# Patient Record
Sex: Female | Born: 1955
Health system: Southern US, Community
[De-identification: ages and names within clinical notes are randomized; demographics above are authoritative.]

## PROBLEM LIST (undated history)

## (undated) DIAGNOSIS — E079 Disorder of thyroid, unspecified: Secondary | ICD-10-CM

## (undated) HISTORY — PX: HERNIA REPAIR: SHX51

## (undated) HISTORY — PX: APPENDECTOMY: SHX54

## (undated) HISTORY — PX: TUBAL LIGATION: SHX77

---

## 2015-11-04 ENCOUNTER — Ambulatory Visit: Payer: Self-pay | Admitting: Sports Medicine

## 2016-04-26 ENCOUNTER — Encounter (HOSPITAL_BASED_OUTPATIENT_CLINIC_OR_DEPARTMENT_OTHER): Payer: Self-pay | Admitting: *Deleted

## 2016-04-26 ENCOUNTER — Emergency Department (HOSPITAL_BASED_OUTPATIENT_CLINIC_OR_DEPARTMENT_OTHER)
Admission: EM | Admit: 2016-04-26 | Discharge: 2016-04-26 | Disposition: A | Discharge status: Home / Self Care | Payer: 59 | Attending: Emergency Medicine | Admitting: Emergency Medicine

## 2016-04-26 ENCOUNTER — Emergency Department (HOSPITAL_BASED_OUTPATIENT_CLINIC_OR_DEPARTMENT_OTHER): Payer: 59

## 2016-04-26 DIAGNOSIS — K529 Noninfective gastroenteritis and colitis, unspecified: Secondary | ICD-10-CM | POA: Insufficient documentation

## 2016-04-26 DIAGNOSIS — R197 Diarrhea, unspecified: Secondary | ICD-10-CM

## 2016-04-26 DIAGNOSIS — R112 Nausea with vomiting, unspecified: Secondary | ICD-10-CM | POA: Diagnosis present

## 2016-04-26 DIAGNOSIS — R1110 Vomiting, unspecified: Secondary | ICD-10-CM

## 2016-04-26 HISTORY — DX: Disorder of thyroid, unspecified: E07.9

## 2016-04-26 LAB — COMPREHENSIVE METABOLIC PANEL
ALK PHOS: 37 U/L — AB (ref 38–126)
ALT: 20 U/L (ref 14–54)
ANION GAP: 10 (ref 5–15)
AST: 34 U/L (ref 15–41)
Albumin: 4.3 g/dL (ref 3.5–5.0)
BILIRUBIN TOTAL: 0.7 mg/dL (ref 0.3–1.2)
BUN: 14 mg/dL (ref 6–20)
CALCIUM: 9.3 mg/dL (ref 8.9–10.3)
CO2: 25 mmol/L (ref 22–32)
Chloride: 104 mmol/L (ref 101–111)
Creatinine, Ser: 0.76 mg/dL (ref 0.44–1.00)
GFR calc non Af Amer: >60 mL/min (ref >60)
GLUCOSE: 131 mg/dL — AB (ref 65–99)
Potassium: 3.4 mmol/L — ABNORMAL LOW (ref 3.5–5.1)
Sodium: 139 mmol/L (ref 135–145)
Total Protein: 7.4 g/dL (ref 6.5–8.1)

## 2016-04-26 LAB — URINALYSIS, ROUTINE W REFLEX MICROSCOPIC
BILIRUBIN URINE: NEGATIVE
Glucose, UA: NEGATIVE mg/dL
Ketones, ur: NEGATIVE mg/dL
NITRITE: NEGATIVE
PROTEIN: NEGATIVE mg/dL
pH: 7.5 (ref 5.0–8.0)

## 2016-04-26 LAB — CBC
HCT: 38.6 % (ref 36.0–46.0)
HEMOGLOBIN: 13.7 g/dL (ref 12.0–15.0)
MCH: 34.3 pg — AB (ref 26.0–34.0)
MCHC: 35.5 g/dL (ref 30.0–36.0)
MCV: 96.5 fL (ref 78.0–100.0)
PLATELETS: 182 10*3/uL (ref 150–400)
RBC: 4 MIL/uL (ref 3.87–5.11)
RDW: 12.1 % (ref 11.5–15.5)
WBC: 6 10*3/uL (ref 4.0–10.5)

## 2016-04-26 LAB — LIPASE, BLOOD: Lipase: 19 U/L (ref 11–51)

## 2016-04-26 LAB — URINALYSIS, MICROSCOPIC (REFLEX)

## 2016-04-26 MED ORDER — ONDANSETRON HCL 4 MG/2ML IJ SOLN
4.0000 mg | Freq: Once | INTRAMUSCULAR | Status: AC
  Administered 2016-04-26: 4 mg via INTRAVENOUS
  Filled 2016-04-26: qty 2

## 2016-04-26 MED ORDER — MORPHINE SULFATE (PF) 4 MG/ML IV SOLN
4.0000 mg | Freq: Once | INTRAVENOUS | Status: AC
  Administered 2016-04-26: 4 mg via INTRAVENOUS
  Filled 2016-04-26: qty 1

## 2016-04-26 MED ORDER — KETOROLAC TROMETHAMINE 15 MG/ML IJ SOLN
7.5000 mg | Freq: Once | INTRAMUSCULAR | Status: AC
  Administered 2016-04-26: 7.5 mg via INTRAVENOUS

## 2016-04-26 MED ORDER — IOPAMIDOL (ISOVUE-300) INJECTION 61%
100.0000 mL | Freq: Once | INTRAVENOUS | Status: AC | PRN
  Administered 2016-04-26: 100 mL via INTRAVENOUS

## 2016-04-26 MED ORDER — METOCLOPRAMIDE HCL 5 MG/ML IJ SOLN
10.0000 mg | Freq: Once | INTRAMUSCULAR | Status: AC
  Administered 2016-04-26: 10 mg via INTRAVENOUS
  Filled 2016-04-26: qty 2

## 2016-04-26 MED ORDER — ONDANSETRON HCL 4 MG PO TABS: 4.0000 mg | ORAL_TABLET | Freq: Four times a day (QID) | ORAL | 0 refills | Status: DC

## 2016-04-26 MED ORDER — KETOROLAC TROMETHAMINE 30 MG/ML IJ SOLN: 10.0000 mg | Freq: Once | INTRAMUSCULAR | Status: DC

## 2016-04-26 MED ORDER — SODIUM CHLORIDE 0.9 % IV BOLUS (SEPSIS)
1000.0000 mL | Freq: Once | INTRAVENOUS | Status: AC
  Administered 2016-04-26: 1000 mL via INTRAVENOUS

## 2016-04-26 MED ORDER — ONDANSETRON 4 MG PO TBDP
4.0000 mg | ORAL_TABLET | Freq: Once | ORAL | Status: AC
  Administered 2016-04-26: 4 mg via ORAL
  Filled 2016-04-26: qty 1

## 2016-04-26 MED ORDER — KETOROLAC TROMETHAMINE 15 MG/ML IJ SOLN
INTRAMUSCULAR | Status: AC
  Filled 2016-04-26: qty 1

## 2016-04-26 MED FILL — ONDANSETRON HCL 4 MG TABLET: 4 | 3 days supply | Qty: 12 | Fill #0

## 2016-04-26 NOTE — Discharge Instructions (Signed)
CT shows signs of a viral GI illness. Take Zofran for nausea. Clear liquid diet for 24 hours. Advance diet as tolerated. May use over-the-counter Imodium for diarrhea. Make sure you are staying hydrated with plenty of fluids. Motrin and Tylenol for pain. Follow-up with her primary care doctor this week. Have discussed other CAT scan findings with you. Return to the ED if your unable to control her vomiting, persistent diarrhea, fevers, bloody stool or for any other reason.

## 2016-04-26 NOTE — ED Triage Notes (Signed)
Vomiting since 2 am. Headache, body aches, diarrhea.

## 2016-04-26 NOTE — ED Provider Notes (Signed)
[facility] Provider Note   CSN: [id number] Arrival date & time: 04/26/16  1419     History   Chief Complaint Chief Complaint  Patient presents with  . Emesis    HPI Kara Gonzalez is a 61 y.o. female.  HPI  61 year old Caucasian female past medical history significant for thyroid disease presents to the ED with complaints of nausea, vomiting, headache, body aches, generalized abdominal pain. Patient states that she woke up at approximately 1 AM with nausea. A few hours later she had several episodes of nonbloody nonbilious emesis. States that she has had 4-5 episodes of emesis today. At approximately 11 AM this morning she developed watery nonbloody diarrhea. Had 3-4 episodes of diarrhea. She complains of generalized abdominal pain. Describes it as intermittent and cramping in nature. She also complains of a mild headache. Denies any visual changes. Denies any lightheadedness or dizziness. Has had poor by mouth intake today. States that she was recently on a Z-Pak last week for an infection. Denies any recent travel outside the [place]. Denies any recent new foods. Denies any sick contacts. Patient also complains of chronic low back pain that is unchanged from prior. Denies any urinary or vaginal symptoms. She denies any fevers but endorses chills. Has not taking at home for her symptoms. Past Medical History:  Diagnosis Date  . Thyroid disease     There are no active problems to display for this patient.   Past Surgical History:  Procedure Laterality Date  . APPENDECTOMY    . HERNIA REPAIR    . TUBAL LIGATION      OB History    No data available       Home Medications    Prior to Admission medications   Medication Sig Start Date End Date Taking? Authorizing Provider  Levothyroxine Sodium (SYNTHROID PO) Take by mouth.   Yes Historical Provider, MD    Family History No family history on file.  Social History Social History  Substance Use  Topics  . Smoking status: Never Smoker  . Smokeless tobacco: Never Used  . Alcohol use Yes     Allergies   Patient has no known allergies.   Review of Systems Review of Systems  Constitutional: Positive for chills. Negative for fever.  Eyes: Negative for visual disturbance.  Respiratory: Negative for cough and shortness of breath.   Cardiovascular: Negative for chest pain and palpitations.  Gastrointestinal: Positive for abdominal pain, diarrhea, nausea and vomiting. Negative for blood in stool.  Genitourinary: Negative for dysuria, flank pain, frequency, hematuria and urgency.  Musculoskeletal: Positive for back pain (chronic).  Skin: Negative.   Neurological: Positive for headaches. Negative for dizziness, syncope, facial asymmetry, weakness and numbness.     Physical Exam Updated Vital Signs BP 113/70 (BP Location: Right Arm)   Pulse 86   Temp 98.3 F (36.8 C) (Oral) Comment: vitals obtained by EMT Valencia-Rojas  Resp 18   Ht 5' 4.5" (1.638 m)   Wt 68 kg   SpO2 100%   BMI 25.35 kg/m   Physical Exam  Constitutional: She is oriented to person, place, and time. She appears well-developed and well-nourished. No distress.  Nontoxic appearing.  HENT:  Head: Normocephalic and atraumatic.  Mouth/Throat: Oropharynx is clear and moist.  Mucous membranes appear dry.  Eyes: Conjunctivae and EOM are normal. Pupils are equal, round, and reactive to light. Right eye exhibits no discharge. Left eye exhibits no discharge. No scleral icterus.  Neck: Normal range of motion. Neck  supple. No thyromegaly present.  No nuchal rigidity.  Cardiovascular: Regular rhythm, normal heart sounds and intact distal pulses.  Exam reveals no gallop and no friction rub.   No murmur heard. Mild tachycardia noted.  Pulmonary/Chest: Effort normal and breath sounds normal. No respiratory distress. She has no wheezes. She has no rales. She exhibits no tenderness.  Abdominal: Soft. Bowel sounds are  normal. She exhibits no distension. There is generalized tenderness. There is no rigidity, no rebound, no guarding and no CVA tenderness.  Musculoskeletal: Normal range of motion.  Lymphadenopathy:    She has no cervical adenopathy.  Neurological: She is alert and oriented to person, place, and time.  The patient is alert, attentive, and oriented x 3. Speech is clear. Cranial nerve II-VII grossly intact. Negative pronator drift. Sensation intact. Strength 5/5 in all extremities. Reflexes 2+ and symmetric at biceps, triceps, knees, and ankles. Rapid alternating movement and fine finger movements intact. Romberg is absent. Posture and gait normal.   Skin: Skin is warm and dry. Capillary refill takes less than 2 seconds.  Nursing note and vitals reviewed.    ED Treatments / Results  Labs (all labs ordered are listed, but only abnormal results are displayed) Labs Reviewed  COMPREHENSIVE METABOLIC PANEL - Abnormal; Notable for the following:       Result Value   Potassium 3.4 (*)    Glucose, Bld 131 (*)    Alkaline Phosphatase 37 (*)    All other components within normal limits  CBC - Abnormal; Notable for the following:    MCH 34.3 (*)    All other components within normal limits  URINALYSIS, ROUTINE W REFLEX MICROSCOPIC - Abnormal; Notable for the following:    Specific Gravity, Urine >1.046 (*)    Hgb urine dipstick TRACE (*)    Leukocytes, UA SMALL (*)    All other components within normal limits  URINALYSIS, MICROSCOPIC (REFLEX) - Abnormal; Notable for the following:    Bacteria, UA RARE (*)    Squamous Epithelial / LPF 0-5 (*)    All other components within normal limits  LIPASE, BLOOD    EKG  EKG Interpretation None       Radiology No results found.  Procedures Procedures (including critical care time)  Medications Ordered in ED Medications  iopamidol (ISOVUE-300) 61 % injection 100 mL (not administered)  ondansetron (ZOFRAN-ODT) disintegrating tablet 4 mg (4  mg Oral Given 04/26/16 1437)  sodium chloride 0.9 % bolus 1,000 mL (1,000 mLs Intravenous New Bag/Given 04/26/16 1506)  ondansetron (ZOFRAN) injection 4 mg (4 mg Intravenous Given 04/26/16 1528)  morphine 4 MG/ML injection 4 mg (4 mg Intravenous Given 04/26/16 1528)     Initial Impression / Assessment and Plan / ED Course  I have reviewed the triage vital signs and the nursing notes.  Pertinent labs & imaging results that were available during my care of the patient were reviewed by me and considered in my medical decision making (see chart for details).     Patient with symptoms consistent with viral gastroenteritis.  Vitals are stable, no fever.  Mild signs of dehydration, tolerating PO fluids > 6 oz.  Lungs are clear.  No focal abdominal pain, no concern for appendicitis, cholecystitis, pancreatitis, ruptured viscus, UTI, kidney stone, or any other abdominal etiology.  Ct scan reveals mild enteririts but no other intra abdominal abnormalities. Ua consistent with dehydration but no infection. SV are stable. HR improved. BP normal. Pt is hemodynamically stable and in NAD on discharge.  Nausea improved and tolerated po fluids.  Other Ct findings dicussed with pt. Supportive therapy indicated with return if symptoms worsen.  Patient counseled. Will d/c with zofran. Given strict return precautions.    Final Clinical Impressions(s) / ED Diagnoses   Final diagnoses:  Gastroenteritis  Vomiting and diarrhea    New Prescriptions Discharge Medication List as of 04/26/2016  5:02 PM    START taking these medications   Details  ondansetron (ZOFRAN) 4 MG tablet Take 1 tablet (4 mg total) by mouth every 6 (six) hours., Starting Tue 04/26/2016, Print         Rise Mu, PA-C 04/29/16 1191    Rise Mu, PA-C 04/29/16 4782    Geoffery Lyons, MD 05/02/16 1459

## 2016-11-17 ENCOUNTER — Ambulatory Visit (INDEPENDENT_AMBULATORY_CARE_PROVIDER_SITE_OTHER): Payer: PRIVATE HEALTH INSURANCE | Admitting: Family Medicine

## 2016-11-17 ENCOUNTER — Encounter: Payer: Self-pay | Admitting: Family Medicine

## 2016-11-17 ENCOUNTER — Ambulatory Visit (INDEPENDENT_AMBULATORY_CARE_PROVIDER_SITE_OTHER)
Admission: RE | Admit: 2016-11-17 | Discharge: 2016-11-17 | Disposition: A | Discharge status: Home / Self Care | Payer: PRIVATE HEALTH INSURANCE | Source: Ambulatory Visit | Attending: Family Medicine | Admitting: Family Medicine

## 2016-11-17 VITALS — BP 100/60 | HR 66 | Ht 64.0 in | Wt 150.0 lb

## 2016-11-17 DIAGNOSIS — M5441 Lumbago with sciatica, right side: Secondary | ICD-10-CM | POA: Diagnosis not present

## 2016-11-17 DIAGNOSIS — M5442 Lumbago with sciatica, left side: Secondary | ICD-10-CM

## 2016-11-17 DIAGNOSIS — G8929 Other chronic pain: Secondary | ICD-10-CM

## 2016-11-17 DIAGNOSIS — M5416 Radiculopathy, lumbar region: Secondary | ICD-10-CM | POA: Diagnosis not present

## 2016-11-17 DIAGNOSIS — M5136 Other intervertebral disc degeneration, lumbar region: Secondary | ICD-10-CM | POA: Diagnosis not present

## 2016-11-17 MED ORDER — VITAMIN D (ERGOCALCIFEROL) 1.25 MG (50000 UNIT) PO CAPS: 50000.0000 [IU] | ORAL_CAPSULE | ORAL | 0 refills | Status: DC

## 2016-11-17 MED ORDER — GABAPENTIN 100 MG PO CAPS: 200.0000 mg | ORAL_CAPSULE | Freq: Every day | ORAL | 3 refills | Status: DC

## 2016-11-17 NOTE — Patient Instructions (Addendum)
Good to see you  Ice 20 minutes 2 times daily. Usually after activity and before bed. Can do heat before Xray downstairs today  Exercises 3 times a week.  Gabapenitn 200mg  at night Once weekly vitamin D for 12 weeks.  Over the counter get  Turmeric 500mg  1-2 times daily  Tart cherry extract 1200mg  at night See me again in 3-4 weeks.

## 2016-11-17 NOTE — Progress Notes (Signed)
[facility] [address] [place], [place] [address] Phone: [phone] Subjective:    I'm seeing this patient by the request  of:    CC: Low back pain  [id number]  Kara Gonzalez is a 61 y.o. female coming in with complaint of right hip pain. Pain shoots down both legs. She had an xray about a year and a half ago. Right hip is rotated in.   Onset- Spring 2017 Location- Right hip  Duration- All the time, night and mornings give her the worse pain Character- Sharp, achy, soreness in the foot Aggravating factors- Golf, yard work, lifting, sleeping Reliving factors- Heat, Ibuprofen, oxycoten  Therapies tried- PT, dry needling, epidural  Severity-5 out of 10 most of the time   patient does bring x-rays. X-ray's were independently visualized by me. Patient does have what appears to be lumbarization of S1. Patient also has diffuse mild multilevel degenerative changes with a 9 mm anterolisthesis of L4 on L5.  Past Medical History:  Diagnosis Date  . Thyroid disease    Past Surgical History:  Procedure Laterality Date  . APPENDECTOMY    . HERNIA REPAIR    . TUBAL LIGATION     Social History   Social History  . Marital status: Single    Spouse name: N/A  . Number of children: N/A  . Years of education: N/A   Social History Main Topics  . Smoking status: Never Smoker  . Smokeless tobacco: Never Used  . Alcohol use Yes  . Drug use: Unknown  . Sexual activity: Not on file   Other Topics Concern  . Not on file   Social History Narrative  . No narrative on file   No Known Allergies No family history on file. No family history of lateral knee disease   Past medical history, social, surgical and family history all reviewed in electronic medical record.  No pertanent information unless stated regarding to the chief complaint.   Review of Systems:Review of systems updated and as accurate as of 11/17/16  No headache, visual changes,  nausea, vomiting, diarrhea, constipation, dizziness, abdominal pain, skin rash, fevers, chills, night sweats, weight loss, swollen lymph nodes, body aches, joint swelling,chest pain, shortness of breath, mood changes. Positive muscle aches  Objective  Blood pressure (!) 123/45, weight 150 lb (68 kg). Systems examined below as of 11/17/16   General: No apparent distress alert and oriented x3 mood and affect normal, dressed appropriately.  HEENT: Pupils equal, extraocular movements intact  Respiratory: Patient's speak in full sentences and does not appear short of breath  Cardiovascular: No lower extremity edema, non tender, no erythema  Skin: Warm dry intact with no signs of infection or rash on extremities or on axial skeleton.  Abdomen: Soft nontender  Neuro: Cranial nerves II through XII are intact, neurovascularly intact in all extremities with 2+ DTRs and 2+ pulses.  Lymph: No lymphadenopathy of posterior or anterior cervical chain or axillae bilaterally.  Gait normal with good balance and coordination.  MSK:  Non tender with full range of motion and good stability and symmetric strength and tone of shoulders, elbows, wrist, hip, knee and ankles bilaterally.  Back Exam:  Inspection: Gery Pray mild degenerative scoliosis noted Motion: Flexion 40 deg, Extension 20 deg, Side Bending to 35 deg bilaterally,  Rotation to 45 deg bilaterally  SLR laying: Negative  XSLR laying: Negative  Palpable tenderness: Tender to palpation in the paraspinal musculature lumbar spine. FABER: Tightness bilaterally. Sensory change: Gross  sensation intact to all lumbar and sacral dermatomes.  Reflexes: 2+ at both patellar tendons, 2+ at achilles tendons, Babinski's downgoing.  Strength at foot  Plantar-flexion: 5/5 Dorsi-flexion: 5/5 Eversion: 5/5 Inversion: 5/5  Leg strength  Quad: 5/5 Hamstring: 5/5 Hip flexor: 5/5 Hip abductors: 5/5  Gait unremarkable.  [id number]; 15 additional minutes spent for Therapeutic  exercises as stated in above notes.  This included exercises focusing on stretching, strengthening, with significant focus on eccentric aspects.   Long term goals include an improvement in range of motion, strength, endurance as well as avoiding reinjury. Patient's frequency would include in 1-2 times a day, 3-5 times a week for a duration of 6-12 weeks. Low back exercises that included:  Pelvic tilt/bracing instruction to focus on control of the pelvic girdle and lower abdominal muscles  Glute strengthening exercises, focusing on proper firing of the glutes without engaging the low back muscles Proper stretching techniques for maximum relief for the hamstrings, hip flexors, low back and some rotation where tolerated    Proper technique shown and discussed handout in great detail with ATC.  All questions were discussed and answered.     Impression and Recommendations:     This case required medical decision making of moderate complexity.      Note: This dictation was prepared with Dragon dictation along with smaller phrase technology. Any transcriptional errors that result from this process are unintentional.

## 2016-11-17 NOTE — Assessment & Plan Note (Signed)
Patient does describe some lumbar radiculopathy. Seems to be more intermittent. X-rays ordered today. Has had a history of degenerative disc disease. We discussed icing regimen, core strengthening. Patient started on gabapentin. This will help with some of the nighttime pain of the radicular symptoms. Patient will try some over-the-counter medications. Home exercises and work with Event organiserathletic trainer today. Follow-up again in 4 weeks

## 2016-12-12 NOTE — Progress Notes (Signed)
[facility] [address] [place], [place] [address] Phone: [phone] Subjective:    I'm seeing this patient by the request  of:    CC: low back follow up   BJY:[id number]:Subjective  Kara Gonzalez is a 61 y.o. female coming in with complaint of low back pain.  Patient was found to have more of a lumbar radiculopathy.  Also degenerative disc disease.  Started on gabapentin. Still having a lot of pain.  Patient states that his referring down her leg a lot more as well.  Patient does have anterior listhesis of the back.  Patient states that it is affecting daily activities.     Past Medical History:  Diagnosis Date  . Thyroid disease    Past Surgical History:  Procedure Laterality Date  . APPENDECTOMY    . HERNIA REPAIR    . TUBAL LIGATION     Social History   Socioeconomic History  . Marital status: Single    Spouse name: None  . Number of children: None  . Years of education: None  . Highest education level: None  Social Needs  . Financial resource strain: None  . Food insecurity - worry: None  . Food insecurity - inability: None  . Transportation needs - medical: None  . Transportation needs - non-medical: None  Occupational History  . None  Tobacco Use  . Smoking status: Never Smoker  . Smokeless tobacco: Never Used  Substance and Sexual Activity  . Alcohol use: Yes  . Drug use: None  . Sexual activity: None  Other Topics Concern  . None  Social History Narrative  . None   No Known Allergies No family history on file.  No family history of autoimmune disease   Past medical history, social, surgical and family history all reviewed in electronic medical record.  No pertanent information unless stated regarding to the chief complaint.   Review of Systems:Review of systems updated and as accurate as of 12/13/16  No headache, visual changes, nausea, vomiting, diarrhea, constipation, dizziness, abdominal pain, skin rash, fevers, chills,  night sweats, weight loss, swollen lymph nodes, body aches, joint swelling, chest pain, shortness of breath, mood changes. + Muscle aches  Objective  Blood pressure 118/74, pulse 64, height 5\' 4"  (1.626 m), weight 152 lb (68.9 kg), SpO2 98 %. Systems examined below as of 12/13/16   General: No apparent distress alert and oriented x3 mood and affect normal, dressed appropriately.  HEENT: Pupils equal, extraocular movements intact  Respiratory: Patient's speak in full sentences and does not appear short of breath  Cardiovascular: No lower extremity edema, non tender, no erythema  Skin: Warm dry intact with no signs of infection or rash on extremities or on axial skeleton.  Abdomen: Soft nontender  Neuro: Cranial nerves II through XII are intact, neurovascularly intact in all extremities with 2+ DTRs and 2+ pulses.  Lymph: No lymphadenopathy of posterior or anterior cervical chain or axillae bilaterally.  Gait normal with good balance and coordination.  MSK:  Non tender with full range of motion and good stability and symmetric strength and tone of shoulders, elbows, wrist, hip, knee and ankles bilaterally.  Back Exam:  Inspection: Loss of lordosis Motion: Flexion 25 deg worsening pain with some radicular symptoms, Extension 25 deg, Side Bending to 25 deg bilaterally,  Rotation to 35 deg bilaterally  SLR laying: Negative  XSLR laying: Negative  Palpable tenderness: Tender to palpation of the paraspinal musculature of lumbar spine right greater  than left. FABER: negative. Sensory change: Gross sensation intact to all lumbar and sacral dermatomes.  Reflexes: 2+ at both patellar tendons, 2+ at achilles tendons, Babinski's downgoing.  Strength at foot  4 out of 5 strength on the right foot and leg compared to the contralateral side especially with plantar flexion    Impression and Recommendations:     This case required medical decision making of moderate complexity.      Note: This  dictation was prepared with Dragon dictation along with smaller phrase technology. Any transcriptional errors that result from this process are unintentional.

## 2016-12-13 ENCOUNTER — Other Ambulatory Visit (INDEPENDENT_AMBULATORY_CARE_PROVIDER_SITE_OTHER): Payer: PRIVATE HEALTH INSURANCE

## 2016-12-13 ENCOUNTER — Ambulatory Visit (INDEPENDENT_AMBULATORY_CARE_PROVIDER_SITE_OTHER): Payer: PRIVATE HEALTH INSURANCE | Admitting: Family Medicine

## 2016-12-13 ENCOUNTER — Encounter: Payer: Self-pay | Admitting: Family Medicine

## 2016-12-13 VITALS — BP 118/74 | HR 64 | Ht 64.0 in | Wt 152.0 lb

## 2016-12-13 DIAGNOSIS — M549 Dorsalgia, unspecified: Secondary | ICD-10-CM | POA: Diagnosis not present

## 2016-12-13 DIAGNOSIS — M5416 Radiculopathy, lumbar region: Secondary | ICD-10-CM | POA: Diagnosis not present

## 2016-12-13 DIAGNOSIS — M353 Polymyalgia rheumatica: Secondary | ICD-10-CM | POA: Insufficient documentation

## 2016-12-13 LAB — CBC WITH DIFFERENTIAL/PLATELET
BASOS PCT: 0.4 % (ref 0.0–3.0)
Basophils Absolute: 0 10*3/uL (ref 0.0–0.1)
EOS PCT: 0.4 % (ref 0.0–5.0)
Eosinophils Absolute: 0 10*3/uL (ref 0.0–0.7)
HEMATOCRIT: 39.8 % (ref 36.0–46.0)
HEMOGLOBIN: 13.2 g/dL (ref 12.0–15.0)
LYMPHS PCT: 30.1 % (ref 12.0–46.0)
Lymphs Abs: 1.6 10*3/uL (ref 0.7–4.0)
MCHC: 33.2 g/dL (ref 30.0–36.0)
MCV: 100.1 fl — AB (ref 78.0–100.0)
MONOS PCT: 6.7 % (ref 3.0–12.0)
Monocytes Absolute: 0.4 10*3/uL (ref 0.1–1.0)
Neutro Abs: 3.3 10*3/uL (ref 1.4–7.7)
Neutrophils Relative %: 62.4 % (ref 43.0–77.0)
Platelets: 270 10*3/uL (ref 150.0–400.0)
RBC: 3.97 Mil/uL (ref 3.87–5.11)
RDW: 12.9 % (ref 11.5–15.5)
WBC: 5.3 10*3/uL (ref 4.0–10.5)

## 2016-12-13 LAB — TSH: TSH: 2.28 u[IU]/mL (ref 0.35–4.50)

## 2016-12-13 LAB — COMPREHENSIVE METABOLIC PANEL
ALBUMIN: 4.6 g/dL (ref 3.5–5.2)
ALK PHOS: 44 U/L (ref 39–117)
ALT: 18 U/L (ref 0–35)
AST: 17 U/L (ref 0–37)
BUN: 14 mg/dL (ref 6–23)
CALCIUM: 9.8 mg/dL (ref 8.4–10.5)
CHLORIDE: 103 meq/L (ref 96–112)
CO2: 29 mEq/L (ref 19–32)
Creatinine, Ser: 0.73 mg/dL (ref 0.40–1.20)
GFR: 85.36 mL/min (ref >60.00)
Glucose, Bld: 114 mg/dL — ABNORMAL HIGH (ref 70–99)
POTASSIUM: 3.9 meq/L (ref 3.5–5.1)
SODIUM: 140 meq/L (ref 135–145)
TOTAL PROTEIN: 7.5 g/dL (ref 6.0–8.3)
Total Bilirubin: 0.5 mg/dL (ref 0.2–1.2)

## 2016-12-13 LAB — C-REACTIVE PROTEIN: CRP: 0.1 mg/dL — ABNORMAL LOW (ref 0.5–20.0)

## 2016-12-13 LAB — IBC PANEL
IRON: 97 ug/dL (ref 42–145)
SATURATION RATIOS: 27.3 % (ref 20.0–50.0)
Transferrin: 254 mg/dL (ref 212.0–360.0)

## 2016-12-13 LAB — URIC ACID: URIC ACID, SERUM: 3.9 mg/dL (ref 2.4–7.0)

## 2016-12-13 LAB — SEDIMENTATION RATE: Sed Rate: 7 mm/hr (ref 0–30)

## 2016-12-13 NOTE — Assessment & Plan Note (Signed)
Worsening straight leg test.  Patient is having worsening radicular symptoms.  Known degenerative disc disease with anterior listhesis.  Arthritic changes as well.  Patient is having worsening symptoms overall as well with aches and pains.  We will get polymyalgia labs as well.  I do feel with the worsening symptoms in advance of the.  Patient has had some in the past but has been quite some time.  Depending on findings patient could be a candidate for epidural steroid injections.

## 2016-12-13 NOTE — Patient Instructions (Signed)
God to see yo u Lets get labs today  We will order MRI  For the back  I hope the manipulation helps Gabapentin 300mg  at night See me again in 3-4 weeks

## 2016-12-14 ENCOUNTER — Other Ambulatory Visit (HOSPITAL_COMMUNITY)
Admission: RE | Admit: 2016-12-14 | Discharge: 2016-12-14 | Disposition: A | Discharge status: Home / Self Care | Payer: PRIVATE HEALTH INSURANCE | Source: Ambulatory Visit | Attending: Obstetrics and Gynecology | Admitting: Obstetrics and Gynecology

## 2016-12-14 ENCOUNTER — Other Ambulatory Visit: Payer: Self-pay | Admitting: Obstetrics and Gynecology

## 2016-12-14 DIAGNOSIS — Z124 Encounter for screening for malignant neoplasm of cervix: Secondary | ICD-10-CM | POA: Diagnosis not present

## 2016-12-15 LAB — VITAMIN D 1,25 DIHYDROXY
VITAMIN D 1, 25 (OH) TOTAL: 65 pg/mL (ref 18–72)
VITAMIN D2 1, 25 (OH): 25 pg/mL
VITAMIN D3 1, 25 (OH): 40 pg/mL

## 2016-12-15 LAB — PTH, INTACT AND CALCIUM
CALCIUM: 9.5 mg/dL (ref 8.6–10.4)
PTH: 62 pg/mL (ref 14–64)

## 2016-12-15 LAB — ANA: ANA: NEGATIVE

## 2016-12-15 LAB — CYCLIC CITRUL PEPTIDE ANTIBODY, IGG

## 2016-12-15 LAB — RHEUMATOID FACTOR: Rhuematoid fact SerPl-aCnc: <14 IU/mL (ref <14)

## 2016-12-16 LAB — CYTOLOGY - PAP
Diagnosis: NEGATIVE
HPV: NOT DETECTED

## 2017-01-02 ENCOUNTER — Ambulatory Visit
Admission: RE | Admit: 2017-01-02 | Discharge: 2017-01-02 | Disposition: A | Discharge status: Home / Self Care | Payer: PRIVATE HEALTH INSURANCE | Source: Ambulatory Visit | Attending: Family Medicine | Admitting: Family Medicine

## 2017-01-02 DIAGNOSIS — M549 Dorsalgia, unspecified: Secondary | ICD-10-CM

## 2017-01-03 ENCOUNTER — Other Ambulatory Visit: Payer: Self-pay

## 2017-01-03 DIAGNOSIS — M5416 Radiculopathy, lumbar region: Secondary | ICD-10-CM

## 2017-01-05 ENCOUNTER — Encounter: Payer: Self-pay | Admitting: Family Medicine

## 2017-01-05 ENCOUNTER — Ambulatory Visit (INDEPENDENT_AMBULATORY_CARE_PROVIDER_SITE_OTHER): Payer: PRIVATE HEALTH INSURANCE | Admitting: Family Medicine

## 2017-01-05 ENCOUNTER — Other Ambulatory Visit: Payer: Self-pay

## 2017-01-05 DIAGNOSIS — M48062 Spinal stenosis, lumbar region with neurogenic claudication: Secondary | ICD-10-CM

## 2017-01-05 MED ORDER — GABAPENTIN 100 MG PO CAPS: 200.0000 mg | ORAL_CAPSULE | Freq: Two times a day (BID) | ORAL | 3 refills | Status: DC

## 2017-01-05 MED ORDER — GABAPENTIN 300 MG PO CAPS: 300.0000 mg | ORAL_CAPSULE | Freq: Every day | ORAL | 1 refills | Status: DC

## 2017-01-05 MED ORDER — PREDNISONE 50 MG PO TABS: 50.0000 mg | ORAL_TABLET | Freq: Every day | ORAL | 0 refills | Status: DC

## 2017-01-05 MED ORDER — TRAMADOL HCL 50 MG PO TABS: 50.0000 mg | ORAL_TABLET | Freq: Four times a day (QID) | ORAL | 0 refills | Status: DC | PRN

## 2017-01-05 NOTE — Assessment & Plan Note (Signed)
Spinal stenosis.  Patient was wondering if she could potentially get a refill of her pain medications including oxycodone which I declined to do.  We did discuss tramadol to help her with coming off of that medication.  Patient will start this and titrate down.  We discussed we will not doing well increase gabapentin with 200 mg 2 times daily and 300 mg at night.  Hopefully patient will respond to the epidural steroid injection.  If no significant improvement patient will need to consider surgical intervention.

## 2017-01-05 NOTE — Patient Instructions (Addendum)
Good to see you  Kara Gonzalez is your friend.  I think the injection will help  I will not fill oxycodone..  I want you to stop that and start tramadol 3 timesa day for 2 weeks with 1 regular strength tylenol.  Then go to 2 times a day for 2 weeks, then 1 time a day for 1 week then stop Prednisone daily for 7 days  Gabapentin 100mg  in AM, 100mg  in PM, 300mg  at night See me again 2 weeks after the injection

## 2017-01-05 NOTE — Progress Notes (Signed)
[facility] [address] [place], [place] [address] Phone: [phone] Subjective:    CC: Back pain follow-up  NGE:[id number]:Subjective  Kara Gonzalez is a 61 y.o. female coming in for back pain. She said that her pain is worse than last visit. She said that she was using gabapentin. She said that she is using 100 but would like the 300. She said the pain has intensified and is in her buttock and hips. She is having epidural injection on January 3rd.  Patient has known severe spinal stenosis at L4-L5.  Patient states that the pain is unrelenting.  He does not want any surgical intervention.  Patient is hoping that the injection would be helpful.  Patient also is discussing the possibility of getting pain medications      Past Medical History:  Diagnosis Date  . Thyroid disease    Past Surgical History:  Procedure Laterality Date  . APPENDECTOMY    . HERNIA REPAIR    . TUBAL LIGATION     Social History   Socioeconomic History  . Marital status: Single    Spouse name: None  . Number of children: None  . Years of education: None  . Highest education level: None  Social Needs  . Financial resource strain: None  . Food insecurity - worry: None  . Food insecurity - inability: None  . Transportation needs - medical: None  . Transportation needs - non-medical: None  Occupational History  . None  Tobacco Use  . Smoking status: Never Smoker  . Smokeless tobacco: Never Used  Substance and Sexual Activity  . Alcohol use: Yes  . Drug use: None  . Sexual activity: None  Other Topics Concern  . None  Social History Narrative  . None   No Known Allergies No family history on file.   Past medical history, social, surgical and family history all reviewed in electronic medical record.  No pertanent information unless stated regarding to the chief complaint.   Review of Systems:Review of systems updated and as accurate as of 01/05/17  No headache, visual  changes, nausea, vomiting, diarrhea, constipation, dizziness, abdominal pain, skin rash, fevers, chills, night sweats, weight loss, swollen lymph nodes, body aches, joint swelling,  chest pain, shortness of breath, mood changes.  Positive muscle aches, body aches  Objective  Blood pressure 118/80, pulse 67, height 5' 4.5" (1.638 m), weight 153 lb (69.4 kg), SpO2 98 %. Systems examined below as of 01/05/17   General: No apparent distress alert and oriented x3 mood and affect normal, dressed appropriately.  HEENT: Pupils equal, extraocular movements intact  Respiratory: Patient's speak in full sentences and does not appear short of breath  Cardiovascular: No lower extremity edema, non tender, no erythema  Skin: Warm dry intact with no signs of infection or rash on extremities or on axial skeleton.  Abdomen: Soft nontender  Neuro: Cranial nerves II through XII are intact, neurovascularly intact in all extremities with 2+ DTRs and 2+ pulses.  Lymph: No lymphadenopathy of posterior or anterior cervical chain or axillae bilaterally.  Gait normal with good balance and coordination.  MSK:  Non tender with full range of motion and good stability and symmetric strength and tone of shoulders, elbows, wrist, hip, knee and ankles bilaterally.  Lumbar spine does show some degenerative scoliosis.  Palpation in the paraspinal musculature of lumbar spine.  Positive straight leg test on the left.  Significant tightness in the hamstrings bilaterally.  Negative Faber test bilaterally.  4+ out of 5 strength and symmetric.  Deep tendon reflexes are intact    Impression and Recommendations:     This case required medical decision making of moderate complexity.      Note: This dictation was prepared with Dragon dictation along with smaller phrase technology. Any transcriptional errors that result from this process are unintentional.

## 2017-01-19 ENCOUNTER — Ambulatory Visit
Admission: RE | Admit: 2017-01-19 | Discharge: 2017-01-19 | Disposition: A | Discharge status: Home / Self Care | Payer: PRIVATE HEALTH INSURANCE | Source: Ambulatory Visit | Attending: Family Medicine | Admitting: Family Medicine

## 2017-01-19 ENCOUNTER — Other Ambulatory Visit: Payer: Self-pay | Admitting: Family Medicine

## 2017-01-19 DIAGNOSIS — M5416 Radiculopathy, lumbar region: Secondary | ICD-10-CM

## 2017-01-19 MED ORDER — IOPAMIDOL (ISOVUE-M 200) INJECTION 41%
1.0000 mL | Freq: Once | INTRAMUSCULAR | Status: AC
  Administered 2017-01-19: 1 mL via EPIDURAL

## 2017-01-19 MED ORDER — METHYLPREDNISOLONE ACETATE 40 MG/ML INJ SUSP (RADIOLOG: 120.0000 mg | Freq: Once | INTRAMUSCULAR | Status: DC

## 2017-01-19 NOTE — Discharge Instructions (Signed)

## 2017-01-20 ENCOUNTER — Telehealth: Payer: Self-pay | Admitting: Nurse Practitioner

## 2017-01-20 NOTE — Telephone Encounter (Signed)
Called to follow up with patient s/p lumbar epidural injection. Pt denies headache and states she is feeling better. No further comlaints at this time Dr Benard Rinkurnes made aware. Next appointment on 01/31/2017.

## 2017-01-25 ENCOUNTER — Ambulatory Visit
Admission: RE | Admit: 2017-01-25 | Discharge: 2017-01-25 | Disposition: A | Discharge status: Home / Self Care | Payer: PRIVATE HEALTH INSURANCE | Source: Ambulatory Visit | Attending: Family Medicine | Admitting: Family Medicine

## 2017-01-25 DIAGNOSIS — M5416 Radiculopathy, lumbar region: Secondary | ICD-10-CM

## 2017-01-25 MED ORDER — METHYLPREDNISOLONE ACETATE 40 MG/ML INJ SUSP (RADIOLOG
120.0000 mg | Freq: Once | INTRAMUSCULAR | Status: AC
  Administered 2017-01-25: 120 mg via EPIDURAL

## 2017-01-25 MED ORDER — IOPAMIDOL (ISOVUE-M 200) INJECTION 41%
1.0000 mL | Freq: Once | INTRAMUSCULAR | Status: AC
  Administered 2017-01-25: 1 mL via EPIDURAL

## 2017-01-25 NOTE — Discharge Instructions (Signed)

## 2017-01-31 ENCOUNTER — Other Ambulatory Visit: Payer: PRIVATE HEALTH INSURANCE

## 2017-01-31 ENCOUNTER — Ambulatory Visit: Payer: PRIVATE HEALTH INSURANCE | Admitting: Family Medicine

## 2017-02-02 ENCOUNTER — Ambulatory Visit: Payer: PRIVATE HEALTH INSURANCE | Admitting: Family Medicine

## 2017-02-08 NOTE — Progress Notes (Signed)
[facility] [address] [place], [place] [address] Phone: [phone] Subjective:     CC: Low back pain follow-up  [id number]  Kara Gonzalez is a 62 y.o. female coming in with complaint of low back pain.  Was found to have anterior listhesis.  Continues to have pain.  Did have advanced imaging.  Severe spinal stenosis at L4-L5.  Patient had a failed injection on January 19, 2017 and could not get in at the L4-L5 area.  Patient had an injection at L3-L4.  This was done on January 9.  Patient states that she has had a decrease in her pain since the epidural. She continues to have pain with walking into the left hip/glute. She also still has pain in the right glute when she wakes up in the morning. She continues to use gabapentin at night.       Past Medical History:  Diagnosis Date  . Thyroid disease    Past Surgical History:  Procedure Laterality Date  . APPENDECTOMY    . HERNIA REPAIR    . TUBAL LIGATION     Social History   Socioeconomic History  . Marital status: Single    Spouse name: Not on file  . Number of children: Not on file  . Years of education: Not on file  . Highest education level: Not on file  Social Needs  . Financial resource strain: Not on file  . Food insecurity - worry: Not on file  . Food insecurity - inability: Not on file  . Transportation needs - medical: Not on file  . Transportation needs - non-medical: Not on file  Occupational History  . Not on file  Tobacco Use  . Smoking status: Never Smoker  . Smokeless tobacco: Never Used  Substance and Sexual Activity  . Alcohol use: Yes  . Drug use: Not on file  . Sexual activity: Not on file  Other Topics Concern  . Not on file  Social History Narrative  . Not on file   No Known Allergies No family history on file.   Past medical history, social, surgical and family history all reviewed in electronic medical record.  No pertanent information unless stated  regarding to the chief complaint.   Review of Systems:Review of systems updated and as accurate as of 02/08/17  No headache, visual changes, nausea, vomiting, diarrhea, constipation, dizziness, abdominal pain, skin rash, fevers, chills, night sweats, weight loss, swollen lymph nodes, body aches, joint swelling, muscle aches, chest pain, shortness of breath, mood changes.   Objective  There were no vitals taken for this visit. Systems examined below as of 02/08/17   General: No apparent distress alert and oriented x3 mood and affect normal, dressed appropriately.  HEENT: Pupils equal, extraocular movements intact  Respiratory: Patient's speak in full sentences and does not appear short of breath  Cardiovascular: No lower extremity edema, non tender, no erythema  Skin: Warm dry intact with no signs of infection or rash on extremities or on axial skeleton.  Abdomen: Soft nontender  Neuro: Cranial nerves II through XII are intact, neurovascularly intact in all extremities with 2+ DTRs and 2+ pulses.  Lymph: No lymphadenopathy of posterior or anterior cervical chain or axillae bilaterally.  Gait normal with good balance and coordination.  MSK:  Non tender with full range of motion and good stability and symmetric strength and tone of shoulders, elbows, wrist, hip, knee and ankles bilaterally.  Back Exam:  Inspection: Loss of lordosis  with degenerative scoliosis Motion: Flexion 40 deg, Extension 35 deg, Side Bending to 20 deg bilaterally,  Rotation to 45 deg bilaterally  SLR laying: Negative  XSLR laying: Negative  Palpable tenderness: Positive tender to palpation still over the paraspinal musculature of L3 through L5. FABER: negative. Sensory change: Gross sensation intact to all lumbar and sacral dermatomes.  Reflexes: 2+ at both patellar tendons, 2+ at achilles tendons, Babinski's downgoing.  Strength at foot  Plantar-flexion: 5/5 Dorsi-flexion: 5/5 Eversion: 5/5 Inversion: 5/5  Leg  strength  Quad: 5/5 Hamstring: 5/5 Hip flexor: 5/5 Hip abductors: 5/5  Gait unremarkable.    Impression and Recommendations:     This case required medical decision making of moderate complexity.      Note: This dictation was prepared with Dragon dictation along with smaller phrase technology. Any transcriptional errors that result from this process are unintentional.

## 2017-02-09 ENCOUNTER — Other Ambulatory Visit: Payer: Self-pay

## 2017-02-09 ENCOUNTER — Ambulatory Visit (INDEPENDENT_AMBULATORY_CARE_PROVIDER_SITE_OTHER): Payer: PRIVATE HEALTH INSURANCE | Admitting: Family Medicine

## 2017-02-09 ENCOUNTER — Encounter: Payer: Self-pay | Admitting: Family Medicine

## 2017-02-09 VITALS — BP 112/74 | HR 89 | Ht 65.0 in | Wt 151.0 lb

## 2017-02-09 DIAGNOSIS — M5416 Radiculopathy, lumbar region: Secondary | ICD-10-CM

## 2017-02-09 DIAGNOSIS — M48062 Spinal stenosis, lumbar region with neurogenic claudication: Secondary | ICD-10-CM | POA: Diagnosis not present

## 2017-02-09 MED ORDER — VITAMIN D (ERGOCALCIFEROL) 1.25 MG (50000 UNIT) PO CAPS: 50000.0000 [IU] | ORAL_CAPSULE | ORAL | 0 refills | Status: AC

## 2017-02-09 NOTE — Assessment & Plan Note (Signed)
Known spinal stenosis.  Patient has done relatively well overall.  We discussed icing regimen and home exercises. Patient will have another epidural done.  Has responded very well to the first 1.  Patient will start with formal physical therapy.  Discussed icing regimen, which activities of doing which wants to avoid.  Patient will come back and see me again in 4 weeks

## 2017-02-09 NOTE — Patient Instructions (Addendum)
Good to see you  We will get yo in with PT  We will order another injection but would try to push it out at least another 2-3 weeks.  Continue the gabapentin  OK with the yoga class 1-2 times a week to start  Once you have the injection see em again 2-3 weeks AFTER the injection

## 2017-03-10 ENCOUNTER — Encounter (HOSPITAL_BASED_OUTPATIENT_CLINIC_OR_DEPARTMENT_OTHER): Payer: Self-pay | Admitting: *Deleted

## 2017-03-10 ENCOUNTER — Emergency Department (HOSPITAL_BASED_OUTPATIENT_CLINIC_OR_DEPARTMENT_OTHER)
Admission: EM | Admit: 2017-03-10 | Discharge: 2017-03-10 | Disposition: A | Discharge status: Home / Self Care | Payer: PRIVATE HEALTH INSURANCE | Attending: Emergency Medicine | Admitting: Emergency Medicine

## 2017-03-10 ENCOUNTER — Emergency Department (HOSPITAL_BASED_OUTPATIENT_CLINIC_OR_DEPARTMENT_OTHER): Payer: PRIVATE HEALTH INSURANCE

## 2017-03-10 ENCOUNTER — Other Ambulatory Visit: Payer: Self-pay

## 2017-03-10 DIAGNOSIS — Y998 Other external cause status: Secondary | ICD-10-CM | POA: Diagnosis not present

## 2017-03-10 DIAGNOSIS — S62661A Nondisplaced fracture of distal phalanx of left index finger, initial encounter for closed fracture: Secondary | ICD-10-CM | POA: Diagnosis not present

## 2017-03-10 DIAGNOSIS — L03012 Cellulitis of left finger: Secondary | ICD-10-CM | POA: Diagnosis not present

## 2017-03-10 DIAGNOSIS — E079 Disorder of thyroid, unspecified: Secondary | ICD-10-CM | POA: Insufficient documentation

## 2017-03-10 DIAGNOSIS — Y929 Unspecified place or not applicable: Secondary | ICD-10-CM | POA: Diagnosis not present

## 2017-03-10 DIAGNOSIS — Y9389 Activity, other specified: Secondary | ICD-10-CM | POA: Insufficient documentation

## 2017-03-10 DIAGNOSIS — S6010XA Contusion of unspecified finger with damage to nail, initial encounter: Secondary | ICD-10-CM | POA: Diagnosis not present

## 2017-03-10 DIAGNOSIS — W231XXA Caught, crushed, jammed, or pinched between stationary objects, initial encounter: Secondary | ICD-10-CM | POA: Diagnosis not present

## 2017-03-10 DIAGNOSIS — S6992XA Unspecified injury of left wrist, hand and finger(s), initial encounter: Secondary | ICD-10-CM | POA: Diagnosis present

## 2017-03-10 DIAGNOSIS — Z79899 Other long term (current) drug therapy: Secondary | ICD-10-CM | POA: Diagnosis not present

## 2017-03-10 MED ORDER — IBUPROFEN 400 MG PO TABS
600.0000 mg | ORAL_TABLET | Freq: Once | ORAL | Status: AC
  Administered 2017-03-10: 600 mg via ORAL
  Filled 2017-03-10: qty 1

## 2017-03-10 MED ORDER — CEPHALEXIN 500 MG PO CAPS: 500.0000 mg | ORAL_CAPSULE | Freq: Three times a day (TID) | ORAL | 0 refills | Status: AC

## 2017-03-10 NOTE — ED Provider Notes (Signed)
[facility] EMERGENCY DEPARTMENT Provider Note   CSN: [id number] Arrival date & time: 03/10/17  1301     History   Chief Complaint Chief Complaint  Patient presents with  . Finger Injury    HPI Kara Gonzalez is a 62 y.o. female.  HPI    62 year old female presents with concern for left index finger pain since she crushed it in the metal glider 4 days ago.  Reports that she was lifting up the metal glider chair for her puppy, and it tipped all the way over, and her finger became crushed between the 2 gliding components of the chair.  Reports she had severe pain following it.  She placed a splint at home, but has continued to have severe pain and came today.  She had a subungual hematoma develop, and over the last day or 2 has noted significant erythema surrounding the edge of her nail bed.  Denies fevers.  Reports that she is been doing some finger exercises, bending it, but is unable to fully flex her finger.  Past Medical History:  Diagnosis Date  . Thyroid disease     Patient Active Problem List   Diagnosis Date Noted  . Spinal stenosis, lumbar region with neurogenic claudication 01/05/2017  . Polymyalgia (HCC) 12/13/2016  . Lumbar radiculopathy 11/17/2016  . Lumbar degenerative disc disease 11/17/2016    Past Surgical History:  Procedure Laterality Date  . APPENDECTOMY    . HERNIA REPAIR    . TUBAL LIGATION      OB History    No data available       Home Medications    Prior to Admission medications   Medication Sig Start Date End Date Taking? Authorizing Provider  cephALEXin (KEFLEX) 500 MG capsule Take 1 capsule (500 mg total) by mouth 3 (three) times daily for 7 days. 03/10/17 03/17/17  Alvira Monday, MD  gabapentin (NEURONTIN) 100 MG capsule Take 2 capsules (200 mg total) by mouth 2 (two) times daily. 01/05/17   Judi Saa, DO  gabapentin (NEURONTIN) 300 MG capsule Take 1 capsule (300 mg total) by mouth at bedtime. 01/05/17   Judi Saa, DO  Levothyroxine Sodium (SYNTHROID PO) Take by mouth.    [provider]  ondansetron (ZOFRAN) 4 MG tablet Take 1 tablet (4 mg total) by mouth every 6 (six) hours. 04/26/16   Rise Mu, PA-C  traMADol (ULTRAM) 50 MG tablet Take 1 tablet (50 mg total) by mouth every 6 (six) hours as needed. 01/05/17   Judi Saa, DO  Vitamin D, Ergocalciferol, (DRISDOL) 50000 units CAPS capsule Take 1 capsule (50,000 Units total) by mouth every 7 (seven) days. 02/09/17   Judi Saa, DO    Family History No family history on file.  Social History Social History   Tobacco Use  . Smoking status: Never Smoker  . Smokeless tobacco: Never Used  Substance Use Topics  . Alcohol use: Yes  . Drug use: Not on file     Allergies   Patient has no known allergies.   Review of Systems Review of Systems  Constitutional: Negative for fever.  Cardiovascular: Negative for chest pain.  Musculoskeletal: Positive for arthralgias. Negative for back pain.  Skin: Positive for rash. Negative for wound.  Neurological: Negative for syncope.     Physical Exam Updated Vital Signs BP 114/74   Pulse 73   Temp 98.1 F (36.7 C) (Oral)   Resp 14   Ht 5\' 5"  (1.651 m)  Wt 68.5 kg (151 lb)   SpO2 100%   BMI 25.13 kg/m   Physical Exam  Constitutional: She is oriented to person, place, and time. She appears well-developed and well-nourished. No distress.  HENT:  Head: Normocephalic and atraumatic.  Eyes: Conjunctivae and EOM are normal.  Neck: Normal range of motion.  Cardiovascular: Normal rate, regular rhythm and intact distal pulses.  Pulmonary/Chest: Effort normal. No respiratory distress.  Musculoskeletal: She exhibits no edema.       Left hand: She exhibits decreased range of motion (slight decreased flexion of left index DIP with swelling of finger, normal extension), tenderness, bony tenderness and swelling (swelling distal finger, subungual hematoma approx 30%  nailbed, contusion on dorsal side. Erythema surrounding nailbed, no fluctuance). She exhibits normal capillary refill, no deformity and no laceration.  Neurological: She is alert and oriented to person, place, and time.  Skin: Skin is warm and dry. No rash noted. She is not diaphoretic. No erythema.  Nursing note and vitals reviewed.    ED Treatments / Results  Labs (all labs ordered are listed, but only abnormal results are displayed) Labs Reviewed - No data to display  EKG  EKG Interpretation None       Radiology Dg Finger Index Left  Result Date: 03/10/2017 CLINICAL DATA:  Heavy object fell upon finger EXAM: LEFT SECOND FINGER 2+V COMPARISON:  None. FINDINGS: Frontal, oblique, and lateral views were obtained. There is a fracture of the distal aspect the second distal phalanx with fracture fragments in essentially anatomic alignment. No other fractures are evident. No dislocation. There is slight osteoarthritic change in the second PIP and DIP joints. No erosive change. IMPRESSION: Nondisplaced fracture distal aspect second distal phalanx. No dislocation. Mild osteoarthritic change in the second PIP and DIP joints. Electronically Signed   By: Bretta BangWilliam  Woodruff III M.D.   On: 03/10/2017 13:21    Procedures Procedures (including critical care time)  Medications Ordered in ED Medications  ibuprofen (ADVIL,MOTRIN) tablet 600 mg (600 mg Oral Given 03/10/17 1340)     Initial Impression / Assessment and Plan / ED Course  I have reviewed the triage vital signs and the nursing notes.  Pertinent labs & imaging results that were available during my care of the patient were reviewed by me and considered in my medical decision making (see chart for details).     62 year old female presents with concern for left index finger pain since she crushed it in the metal glider 4 days ago.  X-ray shows fracture of the distal aspect of the distal phalanx.  Patient with good cap refill in the area,  no sign of other vascular injury.  She does have erythema surrounding the nail bed, concerning for early paronychia.  No sign of fluctuance or drainable abscess or pus at this time.  Discussed that would recommend 4 times daily warm soaks, however given presence of fracture, need for splinting, will give prescription for Keflex for 1 week.  Patient placed in a finger splint in extension.  Gave follow-up information for hand surgery.  Recommend ibuprofen/tylenol for pain, ice, elevation.   Final Clinical Impressions(s) / ED Diagnoses   Final diagnoses:  Closed nondisplaced fracture of distal phalanx of left index finger, initial encounter  Paronychia of finger of left hand  Subungual hematoma of digit of hand, initial encounter    ED Discharge Orders        Ordered    cephALEXin (KEFLEX) 500 MG capsule  3 times daily  03/10/17 1344       Alvira Monday, MD 03/10/17 1426

## 2017-03-10 NOTE — Discharge Instructions (Signed)
You will wear your finger splint for 3-4 weeks.

## 2017-03-10 NOTE — ED Triage Notes (Signed)
Left index finger injury 4 days ago. She tipped a metal glidder over and her finger was crushed. She has a finger splint in place on arrival to the ED. States she put the splint on.

## 2017-03-10 NOTE — ED Notes (Signed)
ED Provider at bedside. 

## 2017-03-10 NOTE — ED Notes (Signed)
Purplish discoloration to left index finger and per patient, it is very painful.

## 2017-03-13 ENCOUNTER — Ambulatory Visit
Admission: RE | Admit: 2017-03-13 | Discharge: 2017-03-13 | Disposition: A | Discharge status: Home / Self Care | Payer: PRIVATE HEALTH INSURANCE | Source: Ambulatory Visit | Attending: Family Medicine | Admitting: Family Medicine

## 2017-03-13 DIAGNOSIS — M5416 Radiculopathy, lumbar region: Secondary | ICD-10-CM

## 2017-03-13 MED ORDER — IOPAMIDOL (ISOVUE-M 200) INJECTION 41%
1.0000 mL | Freq: Once | INTRAMUSCULAR | Status: AC
  Administered 2017-03-13: 1 mL via EPIDURAL

## 2017-03-13 MED ORDER — METHYLPREDNISOLONE ACETATE 40 MG/ML INJ SUSP (RADIOLOG
120.0000 mg | Freq: Once | INTRAMUSCULAR | Status: AC
  Administered 2017-03-13: 120 mg via EPIDURAL

## 2017-03-13 NOTE — Discharge Instructions (Signed)

## 2017-05-23 ENCOUNTER — Encounter: Payer: Self-pay | Admitting: Family Medicine

## 2017-05-23 ENCOUNTER — Ambulatory Visit (INDEPENDENT_AMBULATORY_CARE_PROVIDER_SITE_OTHER): Payer: PRIVATE HEALTH INSURANCE | Admitting: Family Medicine

## 2017-05-23 ENCOUNTER — Ambulatory Visit: Payer: Self-pay

## 2017-05-23 VITALS — BP 116/68 | HR 61 | Ht 65.0 in | Wt 148.0 lb

## 2017-05-23 DIAGNOSIS — M25512 Pain in left shoulder: Secondary | ICD-10-CM

## 2017-05-23 MED ORDER — NITROGLYCERIN 0.2 MG/HR TD PT24: MEDICATED_PATCH | TRANSDERMAL | 11 refills | Status: DC

## 2017-05-23 NOTE — Patient Instructions (Signed)
Please try the exercises  Please avoid any long irons or hitting the driver  Please avoid maneuvers that make your pain worse than a 2/10  Please follow up with me in 4-6 weeks.   Nitroglycerin Protocol   Apply 1/4 nitroglycerin patch to affected area daily.  Change position of patch within the affected area every 24 hours.  You may experience a headache during the first 1-2 weeks of using the patch, these should subside.  If you experience headaches after beginning nitroglycerin patch treatment, you may take your preferred over the counter pain reliever.  Another side effect of the nitroglycerin patch is skin irritation or rash related to patch adhesive.  Please notify our office if you develop more severe headaches or rash, and stop the patch.  Tendon healing with nitroglycerin patch may require 12 to 24 weeks depending on the extent of injury.  Men should not use if taking Viagra, Cialis, or Levitra.   Do not use if you have migraines or rosacea.

## 2017-05-23 NOTE — Progress Notes (Signed)
Kara Gonzalez - 62 y.o. female MRN [id number]  Date of birth: 23-Dec-1955  SUBJECTIVE:  Including CC & ROS.  Chief Complaint  Patient presents with  . Left shoulder pain    Kara Gonzalez is a 62 y.o. female that is presenting with left shoulder pain. Ongoing for one month. Pain is mild to severe. Located anteriorly and radiates down her arm. Admits to tingling. She got a upper body massage last week which seemed to worsen her symptoms. She has been taking advil for the pain. Pain is worse flexion and extension. She has decrease range of motion due to the pain.Denies injury. She noticed the pain after playing a round of golf.  Denies any bruising.  Denies any history of surgery.    Review of Systems  Constitutional: Negative for fever.  HENT: Negative for congestion.   Respiratory: Negative for cough.   Cardiovascular: Negative for chest pain.  Gastrointestinal: Negative for abdominal pain.  Musculoskeletal: Negative for back pain.  Skin: Negative for color change.  Neurological: Negative for weakness.  Hematological: Negative for adenopathy.  Psychiatric/Behavioral: Negative for agitation.    HISTORY: Past Medical, Surgical, Social, and Family History Reviewed & Updated per EMR.   Pertinent Historical Findings include:  Past Medical History:  Diagnosis Date  . Thyroid disease     Past Surgical History:  Procedure Laterality Date  . APPENDECTOMY    . HERNIA REPAIR    . TUBAL LIGATION      No Known Allergies  No family history on file.   Social History   Socioeconomic History  . Marital status: Single    Spouse name: Not on file  . Number of children: Not on file  . Years of education: Not on file  . Highest education level: Not on file  Occupational History  . Not on file  Social Needs  . Financial resource strain: Not on file  . Food insecurity:    Worry: Not on file    Inability: Not on file  . Transportation needs:    Medical: Not on file   Non-medical: Not on file  Tobacco Use  . Smoking status: Never Smoker  . Smokeless tobacco: Never Used  Substance and Sexual Activity  . Alcohol use: Yes  . Drug use: Not on file  . Sexual activity: Not on file  Lifestyle  . Physical activity:    Days per week: Not on file    Minutes per session: Not on file  . Stress: Not on file  Relationships  . Social connections:    Talks on phone: Not on file    Gets together: Not on file    Attends religious service: Not on file    Active member of club or organization: Not on file    Attends meetings of clubs or organizations: Not on file    Relationship status: Not on file  . Intimate partner violence:    Fear of current or ex partner: Not on file    Emotionally abused: Not on file    Physically abused: Not on file    Forced sexual activity: Not on file  Other Topics Concern  . Not on file  Social History Narrative  . Not on file     PHYSICAL EXAM:  VS: BP 116/68 (BP Location: Right Arm, Patient Position: Sitting, Cuff Size: Normal)   Pulse 61   Ht  (1.651 m)   Wt 148 lb (67.1 kg)   SpO2 97%   BMI 24.63  kg/m  Physical Exam Gen: NAD, alert, cooperative with exam, well-appearing ENT: normal lips, normal nasal mucosa,  Eye: normal EOM, normal conjunctiva and lids CV:  no edema, +2 pedal pulses   Resp: no accessory muscle use, non-labored,  Skin: no rashes, no areas of induration  Neuro: normal tone, normal sensation to touch Psych:  normal insight, alert and oriented MSK:  Left shoulder: Normal passive flexion and abduction. Active flexion abduction limited around 100 degrees. Normal external rotation. Normal strength resistance with internal and external rotation. Some pain with empty can testing. No pain with Hawkins testing. Neurovascularly intact  Limited ultrasound: Left shoulder:  Normal-appearing biceps tendon and subscapularis. Normal-appearing anterior leaflet of the supraspinatus.  Posterior  supraspinatus appears to have a mild acromial sided tear less than a centimeter in length.  Also has a subacromial bursitis Normal infraspinatus  Summary: Findings suggest a small supraspinatus posterior leaflet tear and subacromial bursitis  Ultrasound and interpretation by Kara Gandy, MD   Aspiration/Injection Procedure Note Kara Gonzalez 1956/01/07  Procedure: Injection Indications: Left shoulder pain  Procedure Details Consent: Risks of procedure as well as the alternatives and risks of each were explained to the (patient/caregiver).  Consent for procedure obtained. Time Out: Verified patient identification, verified procedure, site/side was marked, verified correct patient position, special equipment/implants available, medications/allergies/relevent history reviewed, required imaging and test results available.  Performed.  The area was cleaned with iodine and alcohol swabs.    The left subacromial bursa was injected using 1 cc's of 40 mg Depomedrol and 4 cc's of 1% lidocaine with a 25 1 1/2" needle.  Ultrasound was used. Images were obtained in  Long views showing the injection.    A sterile dressing was applied.  Patient did tolerate procedure well.      ASSESSMENT & PLAN:   Acute pain of left shoulder Has subacromial bursitis, supraspinatus tendinopathy and small supraspinatus acromial sided tear.  - subacromial injection today  - counseled on HEP  - nitro patches  - f/u in 4-6 weeks. If no improvement consider PT

## 2017-05-23 NOTE — Assessment & Plan Note (Signed)
Has subacromial bursitis, supraspinatus tendinopathy and small supraspinatus acromial sided tear.  - subacromial injection today  - counseled on HEP  - nitro patches  - f/u in 4-6 weeks. If no improvement consider PT

## 2017-06-09 ENCOUNTER — Other Ambulatory Visit: Payer: Self-pay | Admitting: Family Medicine

## 2017-06-09 NOTE — Telephone Encounter (Signed)
Refill done.  

## 2017-06-26 ENCOUNTER — Ambulatory Visit: Payer: BLUE CROSS/BLUE SHIELD | Admitting: Family Medicine

## 2017-06-26 ENCOUNTER — Encounter: Payer: Self-pay | Admitting: Family Medicine

## 2017-06-26 VITALS — BP 122/66 | HR 68 | Ht 65.0 in | Wt 150.0 lb

## 2017-06-26 DIAGNOSIS — M25512 Pain in left shoulder: Secondary | ICD-10-CM

## 2017-06-26 DIAGNOSIS — M5416 Radiculopathy, lumbar region: Secondary | ICD-10-CM

## 2017-06-26 NOTE — Assessment & Plan Note (Addendum)
Has had improvement in her pain.  - monitor for now.

## 2017-06-26 NOTE — Patient Instructions (Signed)
Good to see you.  Please try tylenol when your pain is more regular.  You can use ibuprofen when your pain flares  Please try the pilates

## 2017-06-26 NOTE — Progress Notes (Signed)
Kara Gonzalez - 62 y.o. female MRN [id number]  Date of birth: November 03, 1955  SUBJECTIVE:  Including CC & ROS.  Chief Complaint  Patient presents with  . Follow-up    Kara Gonzalez is a 62 y.o. female that is here today for left shoulder pain follow up. Patient was seen 05/23/17 and received a subacromial injection. Patient states the pain has improved. Admits to improvement in her range of motion.  Denies tingling or numbness. She has been completing exercises at home. She did dry needling to her shoulder, she states it has improved.   Pain in her lower back has been increasing over the past two months. She received an epidural on 03/13/17. Pain did improve at first but has increased lately. Denies weakness. Denies tingling or numbness. Pain is worse when she is sitting.  She has radicular symptoms going down her right leg.  Her symptoms are worse in the morning and improves throughout the day.  She ran out of gabapentin a week ago.  She has this refilled.  She has not been taking Tylenol.  She does take ibuprofen.  She denies any injury to her back.   Review of her MRI lumbar spine from 01/02/2017 shows transitional anatomy at L5-S1.  Moderate to severe stenosis at L4-L5 secondary to facet arthropathy, ligamentum flavum hypertrophy, 5 mm anterior listhesis.  Bilateral L4 and L5 nerve root impingement.   Review of Systems  Constitutional: Negative for fever.  HENT: Negative for congestion.   Respiratory: Negative for cough.   Cardiovascular: Negative for chest pain.  Gastrointestinal: Negative for abdominal pain.  Musculoskeletal: Positive for back pain.  Skin: Negative for color change.  Neurological: Negative for weakness.  Hematological: Does not bruise/bleed easily.    HISTORY: Past Medical, Surgical, Social, and Family History Reviewed & Updated per EMR.   Pertinent Historical Findings include:  Past Medical History:  Diagnosis Date  . Thyroid disease     Past Surgical  History:  Procedure Laterality Date  . APPENDECTOMY    . HERNIA REPAIR    . TUBAL LIGATION      No Known Allergies  No family history on file.   Social History   Socioeconomic History  . Marital status: Single    Spouse name: Not on file  . Number of children: Not on file  . Years of education: Not on file  . Highest education level: Not on file  Occupational History  . Not on file  Social Needs  . Financial resource strain: Not on file  . Food insecurity:    Worry: Not on file    Inability: Not on file  . Transportation needs:    Medical: Not on file    Non-medical: Not on file  Tobacco Use  . Smoking status: Never Smoker  . Smokeless tobacco: Never Used  Substance and Sexual Activity  . Alcohol use: Yes  . Drug use: Not on file  . Sexual activity: Not on file  Lifestyle  . Physical activity:    Days per week: Not on file    Minutes per session: Not on file  . Stress: Not on file  Relationships  . Social connections:    Talks on phone: Not on file    Gets together: Not on file    Attends religious service: Not on file    Active member of club or organization: Not on file    Attends meetings of clubs or organizations: Not on file    Relationship status: Not  on file  . Intimate partner violence:    Fear of current or ex partner: Not on file    Emotionally abused: Not on file    Physically abused: Not on file    Forced sexual activity: Not on file  Other Topics Concern  . Not on file  Social History Narrative  . Not on file     PHYSICAL EXAM:  VS: BP 122/66 (BP Location: Right Arm, Patient Position: Sitting, Cuff Size: Normal)   Pulse 68   Ht 5\' 5"  (1.651 m)   Wt 150 lb (68 kg)   SpO2 98%   BMI 24.96 kg/m  Physical Exam Gen: NAD, alert, cooperative with exam, well-appearing ENT: normal lips, normal nasal mucosa,  Eye: normal EOM, normal conjunctiva and lids CV:  no edema, +2 pedal pulses   Resp: no accessory muscle use, non-labored,  Skin: no  rashes, no areas of induration  Neuro: normal tone, normal sensation to touch Psych:  normal insight, alert and oriented MSK:  Back Exam:  Inspection: Unremarkable  Palpable tenderness: None. Leg strength: Quad: 5/5 Hamstring: 5/5 Hip flexor: 5/5  Strength at foot: Plantar-flexion: 5/5 Dorsi-flexion: 5/5 Eversion: 5/5 Inversion: 5/5  Sensory change: Gross sensation intact to all lumbar and sacral dermatomes.  Reflexes: 2+ at both patellar tendons  Gait unremarkable. Neurovascularly intact       ASSESSMENT & PLAN:   Lumbar radiculopathy Acute on chronic disease with known nerve impingement from MRI.  Likely has component of nerve impingement pain as well as axial pain. -She has gotten her gabapentin refilled -Ordered epidural -Counseled on Tylenol and ibuprofen use. -If no improvement can consider physical therapy or try facet injections.  Acute pain of left shoulder Has had improvement in her pain.  - monitor for now.

## 2017-06-26 NOTE — Assessment & Plan Note (Signed)
Acute on chronic disease with known nerve impingement from MRI.  Likely has component of nerve impingement pain as well as axial pain. -She has gotten her gabapentin refilled -Ordered epidural -Counseled on Tylenol and ibuprofen use. -If no improvement can consider physical therapy or try facet injections.

## 2017-06-29 ENCOUNTER — Ambulatory Visit: Payer: PRIVATE HEALTH INSURANCE | Admitting: Family Medicine

## 2017-07-05 ENCOUNTER — Ambulatory Visit
Admission: RE | Admit: 2017-07-05 | Discharge: 2017-07-05 | Disposition: A | Discharge status: Home / Self Care | Payer: BLUE CROSS/BLUE SHIELD | Source: Ambulatory Visit | Attending: Family Medicine | Admitting: Family Medicine

## 2017-07-05 DIAGNOSIS — M5416 Radiculopathy, lumbar region: Secondary | ICD-10-CM

## 2017-07-05 MED ORDER — IOPAMIDOL (ISOVUE-M 200) INJECTION 41%
1.0000 mL | Freq: Once | INTRAMUSCULAR | Status: AC
  Administered 2017-07-05: 1 mL via EPIDURAL

## 2017-07-05 MED ORDER — METHYLPREDNISOLONE ACETATE 40 MG/ML INJ SUSP (RADIOLOG
120.0000 mg | Freq: Once | INTRAMUSCULAR | Status: AC
  Administered 2017-07-05: 120 mg via EPIDURAL

## 2017-11-12 NOTE — Progress Notes (Signed)
[facility] [address] [place], [place] [address] Phone: [phone] Subjective:     CC: Back pain follow-up  ONG:[id number]  Kara Gonzalez is a 62 y.o. female coming in with complaint of low back pain.  Has anterolisthesis.  Severe spinal stenosis at L4-L5.  Last epidural was July 05, 2017.  Patient states neck pain is worsening overall.  States that she is unable to even walk greater than 200 feet without having to take a break.  Patient feels that her quality of life is decreasing as well.  Rates the severity pain is 8 out of 10.  Patient states that the medications do not seem to be helping as much.       Past Medical History:  Diagnosis Date  . Thyroid disease    Past Surgical History:  Procedure Laterality Date  . APPENDECTOMY    . HERNIA REPAIR    . TUBAL LIGATION     Social History   Socioeconomic History  . Marital status: Single    Spouse name: Not on file  . Number of children: Not on file  . Years of education: Not on file  . Highest education level: Not on file  Occupational History  . Not on file  Social Needs  . Financial resource strain: Not on file  . Food insecurity:    Worry: Not on file    Inability: Not on file  . Transportation needs:    Medical: Not on file    Non-medical: Not on file  Tobacco Use  . Smoking status: Never Smoker  . Smokeless tobacco: Never Used  Substance and Sexual Activity  . Alcohol use: Yes  . Drug use: Not on file  . Sexual activity: Not on file  Lifestyle  . Physical activity:    Days per week: Not on file    Minutes per session: Not on file  . Stress: Not on file  Relationships  . Social connections:    Talks on phone: Not on file    Gets together: Not on file    Attends religious service: Not on file    Active member of club or organization: Not on file    Attends meetings of clubs or organizations: Not on file    Relationship status: Not on file  Other Topics Concern  .  Not on file  Social History Narrative  . Not on file   No Known Allergies No family history on file.  Current Outpatient Medications (Endocrine & Metabolic):  Marland Kitchen  Levothyroxine Sodium (SYNTHROID PO), Take by mouth.    Current Outpatient Medications (Analgesics):  .  celecoxib (CELEBREX) 50 MG capsule, Take 50 mg by mouth 2 (two) times daily.   Current Outpatient Medications (Other):  Marland Kitchen  ALPRAZolam (XANAX) 1 MG tablet, TK 1/2 T PO BID PRN .  gabapentin (NEURONTIN) 300 MG capsule, TAKE 1 CAPSULE(300 MG) BY MOUTH AT BEDTIME .  Vitamin D, Ergocalciferol, (DRISDOL) 50000 units CAPS capsule, Take 1 capsule (50,000 Units total) by mouth every 7 (seven) days.    Past medical history, social, surgical and family history all reviewed in electronic medical record.  No pertanent information unless stated regarding to the chief complaint.   Review of Systems:  No headache, visual changes, nausea, vomiting, diarrhea, constipation, dizziness, abdominal pain, skin rash, fevers, chills, night sweats, weight loss, swollen lymph nodes, body aches, joint swelling,  chest pain, shortness of breath, mood changes.  Positive muscle aches  Objective  Blood pressure 116/68, pulse 75, height 5\' 5"  (1.651 m), weight 156 lb (70.8 kg), SpO2 99 %.    General: No apparent distress alert and oriented x3 mood and affect normal, dressed appropriately.  HEENT: Pupils equal, extraocular movements intact  Respiratory: Patient's speak in full sentences and does not appear short of breath  Cardiovascular: No lower extremity edema, non tender, no erythema  Skin: Warm dry intact with no signs of infection or rash on extremities or on axial skeleton.  Abdomen: Soft nontender  Neuro: Cranial nerves II through XII are intact, neurovascularly intact in all extremities with 2+ DTRs and 2+ pulses.  Lymph: No lymphadenopathy of posterior or anterior cervical chain or axillae bilaterally.  Gait normal with good balance and  coordination.  MSK:  Non tender with full range of motion and good stability and symmetric strength and tone of shoulders, elbows, wrist, hip, knee and ankles bilaterally.  Back Exam:  Inspection: Degenerative scoliosis Motion: Flexion 40 deg, Extension 25 deg, Side Bending to 35 deg bilaterally, Rotation to 35 deg bilaterally  SLR laying: Tightness bilaterally. XSLR laying: Negative  Palpable tenderness: Tender to palpation the paraspinal musculature lumbar spine right greater than left. FABER: Positive Faber bilaterally. Sensory change: Gross sensation intact to all lumbar and sacral dermatomes.  Reflexes: 2+ at both patellar tendons, 2+ at achilles tendons, Babinski's downgoing.  Strength at foot  Plantar-flexion: 4 out of 5 strength       Impression and Recommendations:     This case required medical decision making of moderate complexity. The above documentation has been reviewed and is accurate and complete Judi Saa, DO       Note: This dictation was prepared with Dragon dictation along with smaller phrase technology. Any transcriptional errors that result from this process are unintentional.

## 2017-11-13 ENCOUNTER — Encounter: Payer: Self-pay | Admitting: Family Medicine

## 2017-11-13 ENCOUNTER — Ambulatory Visit: Payer: 59 | Admitting: Family Medicine

## 2017-11-13 VITALS — BP 116/68 | HR 75 | Ht 65.0 in | Wt 156.0 lb

## 2017-11-13 DIAGNOSIS — M5416 Radiculopathy, lumbar region: Secondary | ICD-10-CM | POA: Diagnosis not present

## 2017-11-13 DIAGNOSIS — M48062 Spinal stenosis, lumbar region with neurogenic claudication: Secondary | ICD-10-CM

## 2017-11-13 NOTE — Patient Instructions (Addendum)
Good to see you  Ice is your friend Call [phone] to schedule injection  Nuerosurgery will be call ing you as well  Call me or write me with questions

## 2017-11-13 NOTE — Assessment & Plan Note (Signed)
Spinal stenosis.  Discussed with patient in great length.  Gabapentin, vitamin D, we discussed the potential epidural and go back to the level where we have had more improvement.  Patient though the quality of life is diminishing and would like to refer patient to neurosurgery as well to discuss further evaluation of the spinal stenosis and see if surgical intervention would be beneficial.  Patient will follow-up with me again in 3 to 4 weeks after the epidural to discuss further.  Spent  25 minutes with patient face-to-face and had greater than 50% of counseling including as described above in assessment and plan.

## 2017-11-21 ENCOUNTER — Ambulatory Visit
Admission: RE | Admit: 2017-11-21 | Discharge: 2017-11-21 | Disposition: A | Discharge status: Home / Self Care | Payer: 59 | Source: Ambulatory Visit | Attending: Family Medicine | Admitting: Family Medicine

## 2017-11-21 DIAGNOSIS — M5416 Radiculopathy, lumbar region: Secondary | ICD-10-CM

## 2017-11-21 MED ORDER — METHYLPREDNISOLONE ACETATE 40 MG/ML INJ SUSP (RADIOLOG
120.0000 mg | Freq: Once | INTRAMUSCULAR | Status: AC
  Administered 2017-11-21: 120 mg via EPIDURAL

## 2017-11-21 MED ORDER — IOPAMIDOL (ISOVUE-M 200) INJECTION 41%
1.0000 mL | Freq: Once | INTRAMUSCULAR | Status: AC
  Administered 2017-11-21: 1 mL via EPIDURAL

## 2017-11-21 NOTE — Discharge Instructions (Signed)

## 2017-11-23 ENCOUNTER — Other Ambulatory Visit: Payer: Self-pay | Admitting: Family Medicine

## 2017-12-19 ENCOUNTER — Ambulatory Visit: Payer: 59 | Admitting: Family Medicine

## 2017-12-19 ENCOUNTER — Encounter: Payer: Self-pay | Admitting: Family Medicine

## 2017-12-19 VITALS — BP 128/70 | HR 67 | Ht 65.0 in | Wt 152.2 lb

## 2017-12-19 DIAGNOSIS — F1120 Opioid dependence, uncomplicated: Secondary | ICD-10-CM | POA: Diagnosis not present

## 2017-12-19 DIAGNOSIS — Z9189 Other specified personal risk factors, not elsewhere classified: Secondary | ICD-10-CM

## 2017-12-19 DIAGNOSIS — M48062 Spinal stenosis, lumbar region with neurogenic claudication: Secondary | ICD-10-CM

## 2017-12-19 DIAGNOSIS — M5136 Other intervertebral disc degeneration, lumbar region: Secondary | ICD-10-CM | POA: Diagnosis not present

## 2017-12-19 DIAGNOSIS — F1320 Sedative, hypnotic or anxiolytic dependence, uncomplicated: Secondary | ICD-10-CM | POA: Insufficient documentation

## 2017-12-19 DIAGNOSIS — F419 Anxiety disorder, unspecified: Secondary | ICD-10-CM

## 2017-12-19 MED ORDER — ALPRAZOLAM 1 MG PO TABS: ORAL_TABLET | ORAL | 0 refills | Status: AC

## 2017-12-19 NOTE — Progress Notes (Signed)
Established Patient Office Visit  Subjective:  Patient ID: Kara Gonzalez, female    DOB: [birth year]  Age: 62 y.o. MRN: [id number]  CC:  Chief Complaint  Patient presents with  . Establish Care    HPI Kara Gonzalez presents for establishment of care. She is currently seeing an orthopedic doctor for her back and they are prescribing her Oxycodone.  Patient is seeking to establish care by way of transfer physicians.  She tells me that her doctor is moving to a different location.  Patient is requesting a refill on her Xanax.  She tells me that she has taken this drug for many years now for anxiety.  She says that she is using it primarily at night to help her sleep.  She is taking a half a pill every other night for sleep.  Tells me that if she goes for more than a few days without taking it she starts to shake.  She has taken trazodone in the past but there is been some morning drowsiness.  She is currently taking oxycodone for chronic lower back pain as well.  Usually surgery has been recommended.  She tells me that she is taking oxycodone for over a year now.  She is seeing sports medicine and orthopedics for this medication.  She drinks 1 to 2 glasses of wine daily on a regular basis.  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  Past Medical History:  Diagnosis Date  . Thyroid disease     Past Surgical History:  Procedure Laterality Date  . APPENDECTOMY    . HERNIA REPAIR    . TUBAL LIGATION      History reviewed. No pertinent family history.  Social History   Socioeconomic History  . Marital status: Single    Spouse name: Not on file  . Number of children: Not on file  . Years of education: Not on file  . Highest education level: Not on file  Occupational History  . Not on file  Social Needs  . Financial resource strain: Not on file  . Food insecurity:    Worry: Not on file    Inability: Not on file  . Transportation needs:    Medical:  Not on file    Non-medical: Not on file  Tobacco Use  . Smoking status: Never Smoker  . Smokeless tobacco: Never Used  Substance and Sexual Activity  . Alcohol use: Yes    Comment: 2 glasses of wine daily regularly  . Drug use: Not on file  . Sexual activity: Not on file  Lifestyle  . Physical activity:    Days per week: Not on file    Minutes per session: Not on file  . Stress: Not on file  Relationships  . Social connections:    Talks on phone: Not on file    Gets together: Not on file    Attends religious service: Not on file    Active member of club or organization: Not on file    Attends meetings of clubs or organizations: Not on file    Relationship status: Not on file  . Intimate partner violence:    Fear of current or ex partner: Not on file    Emotionally abused: Not on file    Physically abused: Not on file    Forced sexual activity: Not on file  Other Topics Concern  . Not on file  Social History Narrative  . Not on  file    Outpatient Medications Prior to Visit  Medication Sig Dispense Refill  . celecoxib (CELEBREX) 50 MG capsule Take 50 mg by mouth 2 (two) times daily.    Marland Kitchen gabapentin (NEURONTIN) 300 MG capsule TAKE 1 CAPSULE(300 MG) BY MOUTH AT BEDTIME 90 capsule 0  . levothyroxine (SYNTHROID, LEVOTHROID) 75 MCG tablet Take 75 mcg by mouth daily before breakfast.    . Oxycodone HCl 10 MG TABS Take 10 mg by mouth 4 (four) times daily.    . Vitamin D, Ergocalciferol, (DRISDOL) 50000 units CAPS capsule Take 1 capsule (50,000 Units total) by mouth every 7 (seven) days. 12 capsule 0  . ALPRAZolam (XANAX) 1 MG tablet TK 1/2 T PO BID PRN  2  . oxymorphone (OPANA ER) 10 MG 12 hr tablet Take 10 mg by mouth 4 (four) times daily.    . Levothyroxine Sodium (SYNTHROID PO) Take by mouth.     No facility-administered medications prior to visit.     No Known Allergies  ROS Review of Systems  Constitutional: Negative for diaphoresis, fatigue, fever and unexpected  weight change.  HENT: Positive for postnasal drip and sore throat.   Eyes: Negative for photophobia and visual disturbance.  Respiratory: Negative.   Cardiovascular: Negative.   Gastrointestinal: Negative.   Endocrine: Negative for cold intolerance and heat intolerance.  Musculoskeletal: Positive for back pain.  Psychiatric/Behavioral: Positive for sleep disturbance.      Objective:    Physical Exam  Constitutional: She is oriented to person, place, and time. She appears well-developed and well-nourished. No distress.  HENT:  Head: Normocephalic and atraumatic.  Right Ear: External ear normal.  Mouth/Throat: Oropharynx is clear and moist. No oropharyngeal exudate.  Eyes: Pupils are equal, round, and reactive to light. Conjunctivae are normal. Right eye exhibits no discharge. Left eye exhibits no discharge. No scleral icterus.  Neck: Neck supple. No JVD present. No tracheal deviation present. No thyromegaly present.  Cardiovascular: Normal rate, regular rhythm and normal heart sounds.  Pulmonary/Chest: Effort normal and breath sounds normal. No stridor.  Abdominal: Bowel sounds are normal.  Musculoskeletal:       Lumbar back: She exhibits decreased range of motion.  Lymphadenopathy:    She has no cervical adenopathy.  Neurological: She is alert and oriented to person, place, and time. She has normal strength.  Skin: Skin is warm and dry. She is not diaphoretic.  Psychiatric: She has a normal mood and affect. Her behavior is normal.    BP 128/70   Pulse 67   Ht 5\' 5"  (1.651 m)   Wt 152 lb 4 oz (69.1 kg)   SpO2 96%   BMI 25.34 kg/m  Wt Readings from Last 3 Encounters:  12/19/17 152 lb 4 oz (69.1 kg)  11/13/17 156 lb (70.8 kg)  06/26/17 150 lb (68 kg)   BP Readings from Last 3 Encounters:  12/19/17 128/70  11/21/17 134/73  11/13/17 116/68   Health Maintenance Due  Topic Date Due  . Hepatitis C Screening  1955-01-24  . HIV Screening  02/20/1968  . TETANUS/TDAP   02/20/1972  . MAMMOGRAM  02/20/2003  . COLONOSCOPY  02/20/2003  . INFLUENZA VACCINE  08/17/2017    There are no preventive care reminders to display for this patient.  Lab Results  Component Value Date   TSH 2.28 12/13/2016   Lab Results  Component Value Date   WBC 5.3 12/13/2016   HGB 13.2 12/13/2016   HCT 39.8 12/13/2016   MCV 100.1 (H)  12/13/2016   PLT 270.0 12/13/2016   Lab Results  Component Value Date   NA 140 12/13/2016   K 3.9 12/13/2016   CO2 29 12/13/2016   GLUCOSE 114 (H) 12/13/2016   BUN 14 12/13/2016   CREATININE 0.73 12/13/2016   BILITOT 0.5 12/13/2016   ALKPHOS 44 12/13/2016   AST 17 12/13/2016   ALT 18 12/13/2016   PROT 7.5 12/13/2016   ALBUMIN 4.6 12/13/2016   CALCIUM 9.5 12/13/2016   CALCIUM 9.8 12/13/2016   ANIONGAP 10 04/26/2016   GFR 85.36 12/13/2016   No results found for: CHOL No results found for: HDL No results found for: LDLCALC No results found for: TRIG No results found for: CHOLHDL No results found for: [id number]    Assessment & Plan:   Problem List Items Addressed This Visit      Musculoskeletal and Integument   Lumbar degenerative disc disease   Relevant Medications   Oxycodone HCl 10 MG TABS     Other   Spinal stenosis, lumbar region with neurogenic claudication   Relevant Medications   Oxycodone HCl 10 MG TABS   At risk for adverse drug reaction - Primary   Uncomplicated opioid dependence (HCC)   Benzodiazepine dependence (HCC)   Anxiety   Relevant Medications   ALPRAZolam (XANAX) 1 MG tablet      Meds ordered this encounter  Medications  . ALPRAZolam (XANAX) 1 MG tablet    Sig: Take 1/2 at night as needed for sleep.    Dispense:  30 tablet    Refill:  0   Am very concerned about this patient and her situation.  Patient is concerned as well, I think.  She is at high risk for an adverse drug events given her combination of opioids, benzodiazepines and alcohol.  She will use the Xanax at night only.  She believes  that 30 pills will last her a couple of months.  She will then be retired and we will explore other options for sleep.  I spent over 30 minutes with this patient with the majority of time spent in counseling. Follow-up: Return in about 2 months (around 02/19/2018).

## 2018-02-01 ENCOUNTER — Ambulatory Visit: Payer: 59 | Admitting: Family Medicine

## 2018-02-01 ENCOUNTER — Ambulatory Visit (INDEPENDENT_AMBULATORY_CARE_PROVIDER_SITE_OTHER): Payer: 59

## 2018-02-01 ENCOUNTER — Encounter: Payer: Self-pay | Admitting: Family Medicine

## 2018-02-01 VITALS — BP 98/64 | HR 80 | Ht 65.0 in | Wt 155.6 lb

## 2018-02-01 DIAGNOSIS — G8929 Other chronic pain: Secondary | ICD-10-CM | POA: Diagnosis not present

## 2018-02-01 DIAGNOSIS — M79644 Pain in right finger(s): Secondary | ICD-10-CM

## 2018-02-01 DIAGNOSIS — M19049 Primary osteoarthritis, unspecified hand: Secondary | ICD-10-CM | POA: Diagnosis not present

## 2018-02-01 NOTE — Progress Notes (Signed)
Ellene Marko - 63 y.o. female MRN [id number]  Date of birth: October 08, 1955  SUBJECTIVE:  Including CC & ROS.  Chief Complaint  Patient presents with  . Hand Pain    right thumb     Emmajo Echavarria is a 63 y.o. female that is is presenting with right thumb pain.  The pain is been occurring over the past few days.  She has been using a chainsaw and feels like that has exacerbated the pain.  It is located at the base of her thumb.  It is constant and localized to this area.  The pain is worse when she is trying to grip things.  It is also worse anytime she is trying to use her right hand.  Pain is severe.  No improvement with Celebrex.  No history of similar pain.  No prior injections or surgery.  No overlying redness or swelling.   Review of Systems  Constitutional: Negative for fever.  HENT: Negative for congestion.   Respiratory: Negative for cough.   Cardiovascular: Negative for chest pain.  Gastrointestinal: Negative for abdominal pain.  Musculoskeletal: Positive for arthralgias and back pain.  Skin: Negative for color change.  Neurological: Negative for weakness.  Hematological: Negative for adenopathy.  Psychiatric/Behavioral: Negative for agitation.    HISTORY: Past Medical, Surgical, Social, and Family History Reviewed & Updated per EMR.   Pertinent Historical Findings include:  Past Medical History:  Diagnosis Date  . Thyroid disease     Past Surgical History:  Procedure Laterality Date  . APPENDECTOMY    . HERNIA REPAIR    . TUBAL LIGATION      No Known Allergies  No family history on file.   Social History   Socioeconomic History  . Marital status: Single    Spouse name: Not on file  . Number of children: Not on file  . Years of education: Not on file  . Highest education level: Not on file  Occupational History  . Not on file  Social Needs  . Financial resource strain: Not on file  . Food insecurity:    Worry: Not on file    Inability: Not on file   . Transportation needs:    Medical: Not on file    Non-medical: Not on file  Tobacco Use  . Smoking status: Never Smoker  . Smokeless tobacco: Never Used  Substance and Sexual Activity  . Alcohol use: Yes    Comment: 2 glasses of wine daily regularly  . Drug use: Not on file  . Sexual activity: Not on file  Lifestyle  . Physical activity:    Days per week: Not on file    Minutes per session: Not on file  . Stress: Not on file  Relationships  . Social connections:    Talks on phone: Not on file    Gets together: Not on file    Attends religious service: Not on file    Active member of club or organization: Not on file    Attends meetings of clubs or organizations: Not on file    Relationship status: Not on file  . Intimate partner violence:    Fear of current or ex partner: Not on file    Emotionally abused: Not on file    Physically abused: Not on file    Forced sexual activity: Not on file  Other Topics Concern  . Not on file  Social History Narrative  . Not on file     PHYSICAL EXAM:  VS: BP 98/64   Pulse 80   Ht 5\' 5"  (1.651 m)   Wt 155 lb 9.6 oz (70.6 kg)   SpO2 99%   BMI 25.89 kg/m  Physical Exam Gen: NAD, alert, cooperative with exam, well-appearing ENT: normal lips, normal nasal mucosa,  Eye: normal EOM, normal conjunctiva and lids CV:  no edema, +2 pedal pulses   Resp: no accessory muscle use, non-labored,   Skin: no rashes, no areas of induration  Neuro: normal tone, normal sensation to touch Psych:  normal insight, alert and oriented MSK:  Right thumb:  Tenderness to palpation over the [facility] joint. Normal active extension, flexion, and opposition. Pain with gripping. Normal strength resistance with hyperextension. No overlying erythema or swelling. Negative Finkelstein's test. Neurovascularly intact  Limited ultrasound: Right thumb:  Effusion and degenerative changes appreciated the [facility] joint. Normal-appearing first dorsal compartment and  second dorsal compartment. Normal-appearing distal radius.  Summary: Findings suggestive of an effusion and degenerative change of the [facility] joint  Ultrasound and interpretation by Clare Gandy, MD   Aspiration/Injection Procedure Note Tashvi Francies [birth year]  Procedure: Injection Indications: Right thumb pain  Procedure Details Consent: Risks of procedure as well as the alternatives and risks of each were explained to the (patient/caregiver).  Consent for procedure obtained. Time Out: Verified patient identification, verified procedure, site/side was marked, verified correct patient position, special equipment/implants available, medications/allergies/relevent history reviewed, required imaging and test results available.  Performed.  The area was cleaned with iodine and alcohol swabs.    The right [facility] joint was injected using 0.5 cc's of 40 mg Kenalog and 0.5 cc's of 0.25% bupivacaine with a 25 1 1/2" needle.  Ultrasound was used. Images were obtained in trans views showing the injection.     A sterile dressing was applied.  Patient did tolerate procedure well.      ASSESSMENT & PLAN:   CMC arthritis Pain is likely the result of the [facility] joint secondary to the use of the chainsaw.  Does not appear to be de Quervain's.  -Injection today. -Provided Pennsaid samples. -Counseled on bracing and supportive care. -If no improvement may need to consider imaging or physical therapy.

## 2018-02-01 NOTE — Patient Instructions (Signed)
Good to see you  Please try ice on the area  Please use tylenol  Please use the rub on medicine  Please follow up with me in 3-4 weeks if no improvement

## 2018-02-01 NOTE — Assessment & Plan Note (Signed)
Pain is likely the result of the [facility] joint secondary to the use of the chainsaw.  Does not appear to be de Quervain's.  -Injection today. -Provided Pennsaid samples. -Counseled on bracing and supportive care. -If no improvement may need to consider imaging or physical therapy.

## 2018-02-21 ENCOUNTER — Other Ambulatory Visit: Payer: Self-pay | Admitting: Family Medicine

## 2018-02-21 MED ORDER — GABAPENTIN 300 MG PO CAPS: ORAL_CAPSULE | ORAL | 0 refills | Status: DC

## 2018-02-21 NOTE — Telephone Encounter (Signed)
Copied from CRM [id number]. Topic: Quick Communication - Rx Refill/Question >> Feb 21, 2018 11:07 AM Gaynelle Adu wrote: Medication: gabapentin (NEURONTIN) 300 MG capsule  Has the patient contacted their pharmacy? Yes   Preferred Pharmacy (with phone number or street name): [id number] - [place], [place] - [address] AT [facility] [address] [phone] (Phone) [phone] (Fax)    Patient is completely out and  Will be traveling out of town for her mother funeral. Please advise

## 2018-02-21 NOTE — Telephone Encounter (Signed)
Requested Prescriptions  Pending Prescriptions Disp Refills  . gabapentin (NEURONTIN) 300 MG capsule 90 capsule 0    Sig: TAKE 1 CAPSULE(300 MG) BY MOUTH AT BEDTIME     Neurology: Anticonvulsants - gabapentin Passed - 02/21/2018 11:19 AM      Passed - Valid encounter within last 12 months    Recent Outpatient Visits          2 weeks ago [facility] arthritis   [facility] Jordan Likes, Marye Round, MD   2 months ago At risk for adverse drug reaction   [facility]vel Ricks, Talmadge Coventry, MD   3 months ago Lumbar radiculopathy   [facility] -Johnanna Schneiders, DO   8 months ago Lumbar radiculopathy   [facility] Myra Rude, MD   9 months ago Acute pain of left shoulder   [facility] -Celine Mans, Marye Round, MD

## 2018-03-01 DIAGNOSIS — Z79899 Other long term (current) drug therapy: Secondary | ICD-10-CM | POA: Diagnosis not present

## 2018-03-01 DIAGNOSIS — Z5181 Encounter for therapeutic drug level monitoring: Secondary | ICD-10-CM | POA: Diagnosis not present

## 2018-03-01 DIAGNOSIS — G894 Chronic pain syndrome: Secondary | ICD-10-CM | POA: Diagnosis not present

## 2018-03-19 ENCOUNTER — Telehealth: Payer: Self-pay | Admitting: Family Medicine

## 2018-03-19 MED ORDER — GABAPENTIN 300 MG PO CAPS: ORAL_CAPSULE | ORAL | 0 refills | Status: DC

## 2018-03-19 NOTE — Telephone Encounter (Signed)
Rx sent in and patient is aware. 

## 2018-03-19 NOTE — Telephone Encounter (Signed)
Copied from CRM [id number]. Topic: Quick Communication - See Telephone Encounter >> Mar 19, 2018 12:47 PM [name] wrote: CRM for notification. See Telephone encounter for: 03/19/18. Patient is leaving for [place] tomorrow and she needs her gabapentin (NEURONTIN) 300 MG capsule prescription refilled early because she will run out of medication before she returns from [address]

## 2018-03-29 DIAGNOSIS — F331 Major depressive disorder, recurrent, moderate: Secondary | ICD-10-CM | POA: Diagnosis not present

## 2018-03-29 DIAGNOSIS — F411 Generalized anxiety disorder: Secondary | ICD-10-CM | POA: Diagnosis not present

## 2018-03-29 DIAGNOSIS — R350 Frequency of micturition: Secondary | ICD-10-CM | POA: Diagnosis not present

## 2018-03-29 DIAGNOSIS — G8929 Other chronic pain: Secondary | ICD-10-CM | POA: Diagnosis not present

## 2018-04-23 NOTE — Progress Notes (Signed)
Tawana ScaleZach Montavis Schubring D.O. [facility] [address] [place], [place] [address] Phone: [phone] Subjective:    Virtual Visit via Video Note  I connected with Kara KavaPatricia Gonzalez on 04/23/18 at  8:45 AM EDT by a video enabled telemedicine application and verified that I am speaking with the correct person using two identifiers.   I discussed the limitations of evaluation and management by telemedicine and the availability of in person appointments. The patient expressed understanding and agreed to proceed.    I discussed the assessment and treatment plan with the patient. The patient was provided an opportunity to ask questions and all were answered. The patient agreed with the plan and demonstrated an understanding of the instructions.   The patient was advised to call back or seek an in-person evaluation if the symptoms worsen or if the condition fails to improve as anticipated.  I provided 26 minutes of non-face-to-face time during this encounter.  This included counseling on the different treatment options.  With patient having the coronavirus outbreak in the community unable to do the physical therapy and has had nearly 5 months of resolution of pain since the last epidural and hopefully will continue to make improvement.  We will repeat the epidural   Judi SaaZachary M Coren Crownover, DO  More detailed note below   CC: back pain follow-up  BJY:[id number]:Subjective  Kara Kavaatricia Gonzalez is a 63 y.o. female coming in with complaint of low back pain.  Had respond to an epidural at the L3-L4 level back in November.  Patient was nearly pain-free.  Only taking gabapentin at night.  Over the course of the last month started having increasing discomfort and pain again.  Patient is here on a virtual visit and trying to social distance.  Wanting to know if they are doing any type of epidurals because she knows that that has given her the best benefit.  Patient would like to avoid any other medications if possible.      Past Medical History:  Diagnosis Date  . Thyroid disease    Past Surgical History:  Procedure Laterality Date  . APPENDECTOMY    . HERNIA REPAIR    . TUBAL LIGATION     Social History   Socioeconomic History  . Marital status: Single    Spouse name: Not on file  . Number of children: Not on file  . Years of education: Not on file  . Highest education level: Not on file  Occupational History  . Not on file  Social Needs  . Financial resource strain: Not on file  . Food insecurity:    Worry: Not on file    Inability: Not on file  . Transportation needs:    Medical: Not on file    Non-medical: Not on file  Tobacco Use  . Smoking status: Never Smoker  . Smokeless tobacco: Never Used  Substance and Sexual Activity  . Alcohol use: Yes    Comment: 2 glasses of wine daily regularly  . Drug use: Not on file  . Sexual activity: Not on file  Lifestyle  . Physical activity:    Days per week: Not on file    Minutes per session: Not on file  . Stress: Not on file  Relationships  . Social connections:    Talks on phone: Not on file    Gets together: Not on file    Attends religious service: Not on file    Active member of club or organization: Not on file  Attends meetings of clubs or organizations: Not on file    Relationship status: Not on file  Other Topics Concern  . Not on file  Social History Narrative  . Not on file   No Known Allergies No family history on file.  Current Outpatient Medications (Endocrine & Metabolic):  .  levothyroxine (SYNTHROID, LEVOTHROID) 75 MCG tablet, Take 75 mcg by mouth daily before breakfast.    Current Outpatient Medications (Analgesics):  .  celecoxib (CELEBREX) 50 MG capsule, Take 50 mg by mouth 2 (two) times daily. .  Oxycodone HCl 10 MG TABS, Take 10 mg by mouth 4 (four) times daily.   Current Outpatient Medications (Other):  Marland Kitchen  ALPRAZolam (XANAX) 1 MG tablet, Take 1/2 at night as needed for sleep. Marland Kitchen  buPROPion  (WELLBUTRIN XL) 150 MG 24 hr tablet, bupropion HCl XL 150 mg 24 hr tablet, extended release .  estradiol (ESTRACE) 0.1 MG/GM vaginal cream, estradiol 0.01% (0.1 mg/gram) vaginal cream  U 1 GRAM VAGINALLY 2 TIMES A WK UTD .  gabapentin (NEURONTIN) 300 MG capsule, TAKE 1 CAPSULE(300 MG) BY MOUTH AT BEDTIME .  Vitamin D, Ergocalciferol, (DRISDOL) 50000 units CAPS capsule, Take 1 capsule (50,000 Units total) by mouth every 7 (seven) days.    Past medical history, social, surgical and family history all reviewed in electronic medical record.  No pertanent information unless stated regarding to the chief complaint.   Review of Systems:  No headache, visual changes, nausea, vomiting, diarrhea, constipation, dizziness, abdominal pain, skin rash, fevers, chills, night sweats, weight loss, swollen lymph nodes, body aches, joint swelling,  chest pain, shortness of breath, mood changes.  Positive muscle aches  Objective  [place] visit   General: No apparent distress alert and oriented x3 mood and affect normal, dressed appropriately.  HEENT: Pupils equal, extraocular movements intact  Respiratory: Patient's speak in full sentences and does not appear short of breath  .     Impression and Recommendations:     . The above documentation has been reviewed and is accurate and complete Judi Saa, DO       Note: This dictation was prepared with Dragon dictation along with smaller phrase technology. Any transcriptional errors that result from this process are unintentional.

## 2018-04-24 ENCOUNTER — Encounter: Payer: Self-pay | Admitting: Family Medicine

## 2018-04-24 ENCOUNTER — Ambulatory Visit (INDEPENDENT_AMBULATORY_CARE_PROVIDER_SITE_OTHER): Payer: Self-pay | Admitting: Family Medicine

## 2018-04-24 DIAGNOSIS — M48062 Spinal stenosis, lumbar region with neurogenic claudication: Secondary | ICD-10-CM

## 2018-04-24 NOTE — Assessment & Plan Note (Signed)
Patient has known spinal stenosis from MRI previously and has responded well to the last epidural that was nearly 5 months ago.  We discussed repeating again at this time that I think will be beneficial and hopefully will get her even better improvement.  Patient has not taken any pain medication and is only taking the gabapentin.  Encourage her to continue this at the moment.  Patient will call over to the facility that would usually do the epidurals and see if they are willing to do one at this moment.  Order placed today.  Discussed worsening pain to call us immediately.

## 2018-05-02 DIAGNOSIS — F411 Generalized anxiety disorder: Secondary | ICD-10-CM | POA: Diagnosis not present

## 2018-05-02 DIAGNOSIS — F331 Major depressive disorder, recurrent, moderate: Secondary | ICD-10-CM | POA: Diagnosis not present

## 2018-06-01 DIAGNOSIS — F411 Generalized anxiety disorder: Secondary | ICD-10-CM | POA: Diagnosis not present

## 2018-06-01 DIAGNOSIS — F331 Major depressive disorder, recurrent, moderate: Secondary | ICD-10-CM | POA: Diagnosis not present

## 2018-06-13 ENCOUNTER — Other Ambulatory Visit: Payer: Self-pay

## 2018-06-13 ENCOUNTER — Ambulatory Visit
Admission: RE | Admit: 2018-06-13 | Discharge: 2018-06-13 | Disposition: A | Discharge status: Home / Self Care | Payer: Medicare Other | Source: Ambulatory Visit | Attending: Family Medicine | Admitting: Family Medicine

## 2018-06-13 DIAGNOSIS — M48062 Spinal stenosis, lumbar region with neurogenic claudication: Secondary | ICD-10-CM

## 2018-06-13 DIAGNOSIS — M545 Low back pain: Secondary | ICD-10-CM | POA: Diagnosis not present

## 2018-06-13 MED ORDER — IOPAMIDOL (ISOVUE-M 200) INJECTION 41%
1.0000 mL | Freq: Once | INTRAMUSCULAR | Status: AC
  Administered 2018-06-13: 13:00:00 1 mL via EPIDURAL

## 2018-06-13 MED ORDER — METHYLPREDNISOLONE ACETATE 40 MG/ML INJ SUSP (RADIOLOG
120.0000 mg | Freq: Once | INTRAMUSCULAR | Status: AC
  Administered 2018-06-13: 120 mg via EPIDURAL

## 2018-06-13 NOTE — Discharge Instructions (Signed)

## 2018-06-18 ENCOUNTER — Other Ambulatory Visit: Payer: Self-pay | Admitting: Family Medicine

## 2018-07-10 DIAGNOSIS — G894 Chronic pain syndrome: Secondary | ICD-10-CM | POA: Diagnosis not present

## 2018-07-10 DIAGNOSIS — M5136 Other intervertebral disc degeneration, lumbar region: Secondary | ICD-10-CM | POA: Diagnosis not present

## 2018-07-10 DIAGNOSIS — M545 Low back pain: Secondary | ICD-10-CM | POA: Diagnosis not present

## 2018-07-19 DIAGNOSIS — F4323 Adjustment disorder with mixed anxiety and depressed mood: Secondary | ICD-10-CM | POA: Diagnosis not present

## 2018-07-26 DIAGNOSIS — F4323 Adjustment disorder with mixed anxiety and depressed mood: Secondary | ICD-10-CM | POA: Diagnosis not present

## 2018-07-26 IMAGING — MR MR LUMBAR SPINE W/O CM
4 of 5 series · 26 of 48 positions shown · non-contrast
Comparison: Plain films 11/17/2016.

CLINICAL DATA: Low back pain.  BILATERAL leg weakness.

EXAM:
MRI LUMBAR SPINE WITHOUT CONTRAST
TECHNIQUE: Multiplanar, multisequence MR imaging of the lumbar spine was
performed. No intravenous contrast was administered.

[Series 4: T1 · sagittal · 4.0mm · 0.57mm/px · 5 of 13 slices shown (1 of 2)]
[im 1/13]
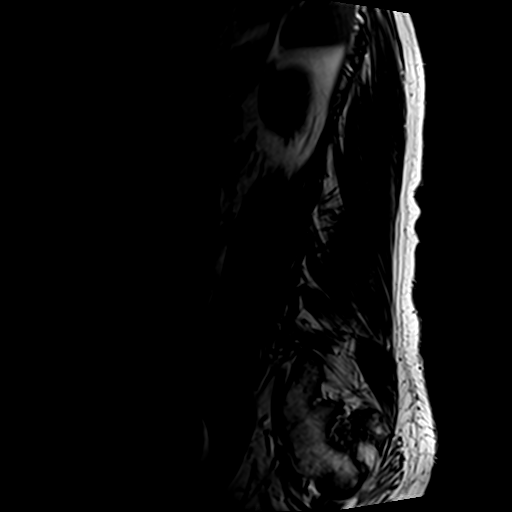
[im 4/13]
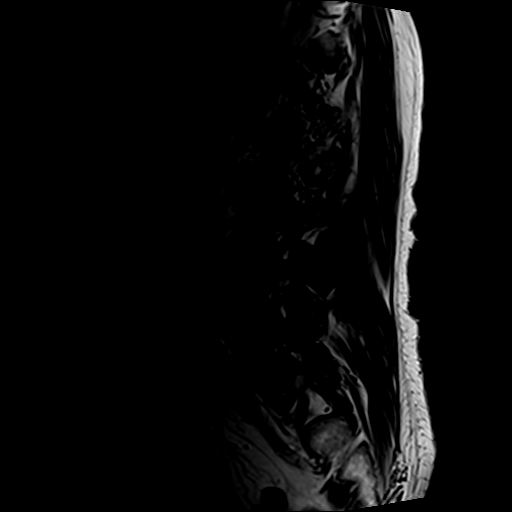
[im 7/13]
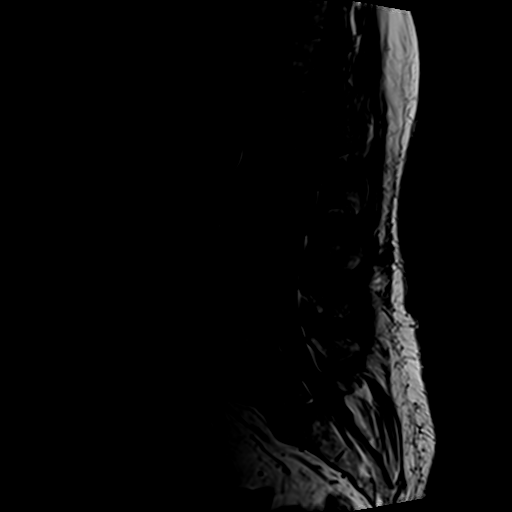
[im 10/13]
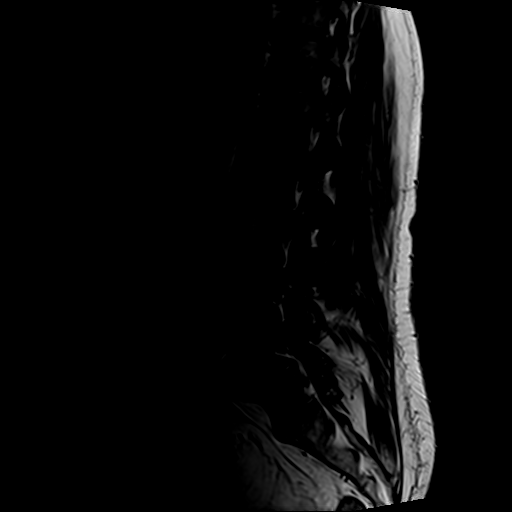
[im 13/13]
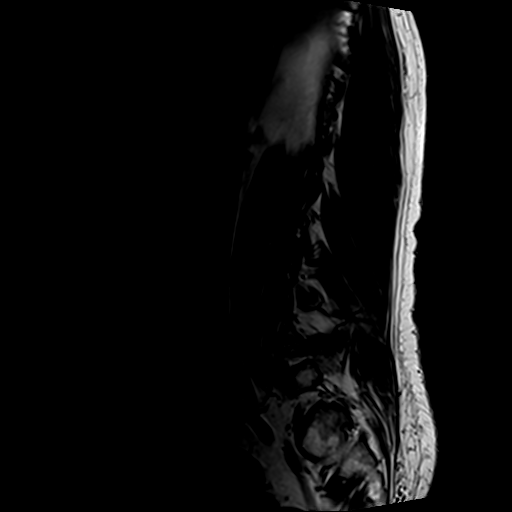

[Series 5: T2 post-contrast · sagittal · 4.0mm · 0.57mm/px · 5 of 13 slices shown]
[im 1/13]
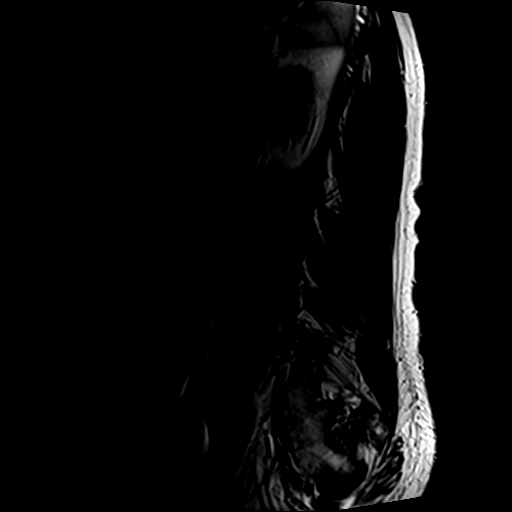
[im 4/13]
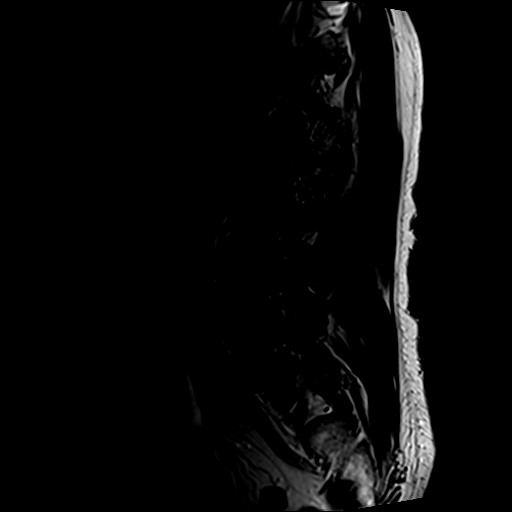
[im 7/13]
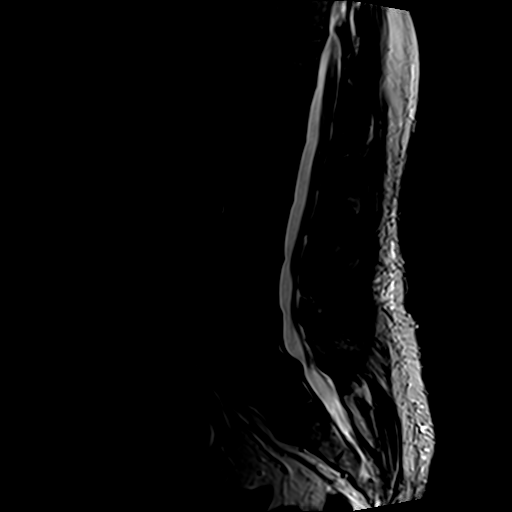
[im 10/13]
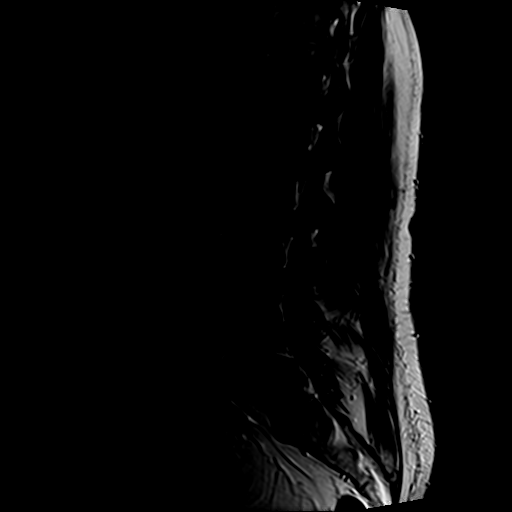
[im 13/13]
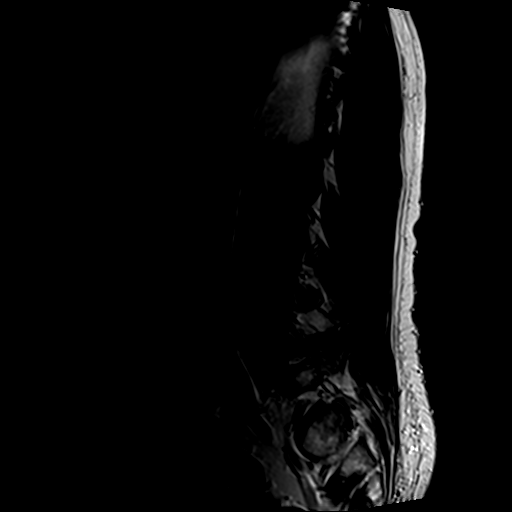

[Series 6: T2 · axial · 4.0mm · 0.70mm/px · z∈[-47,+169]mm · 10 of 42 slices shown]
[im 3/42]
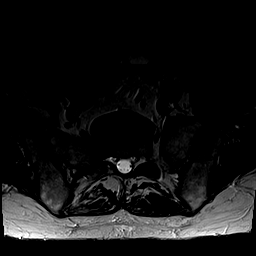
[im 6/42]
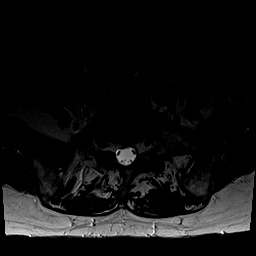
[im 9/42]
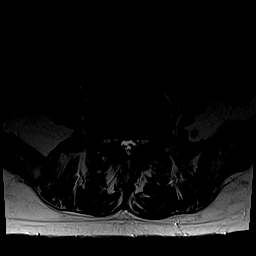
[im 14/42]
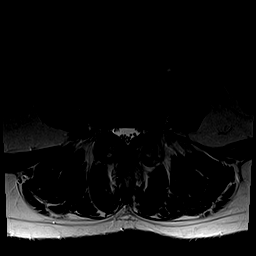
[im 20/42]
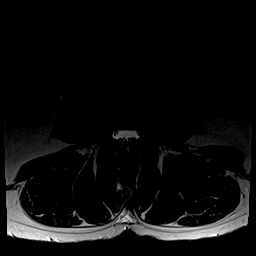
[im 22/42]
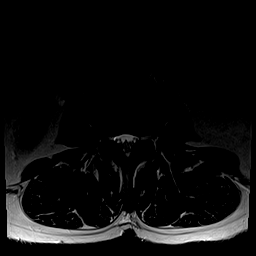
[im 25/42]
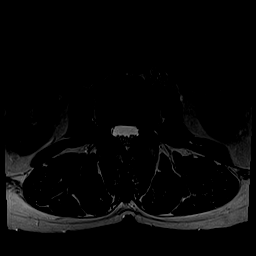
[im 31/42]
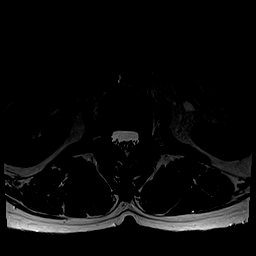
[im 36/42]
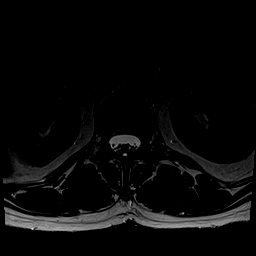
[im 42/42]
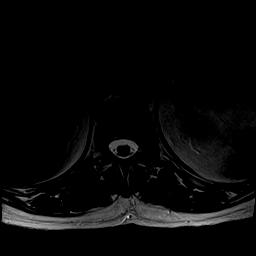

[Series 7: T1 · axial · 4.0mm · 0.35mm/px · z∈[-47,+139]mm · 6 of 42 slices shown (2 of 2)]
[im 3/42]
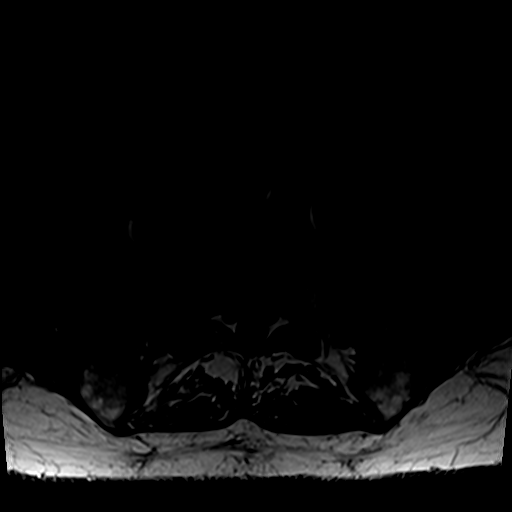
[im 6/42]
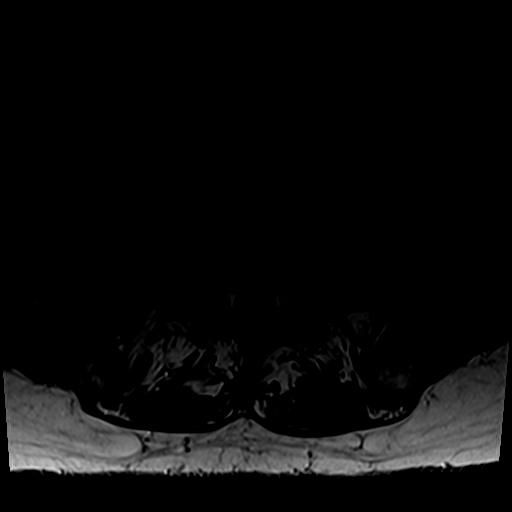
[im 9/42]
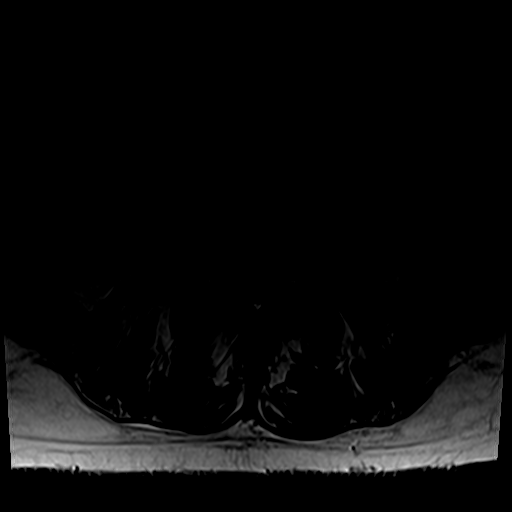
[im 14/42]
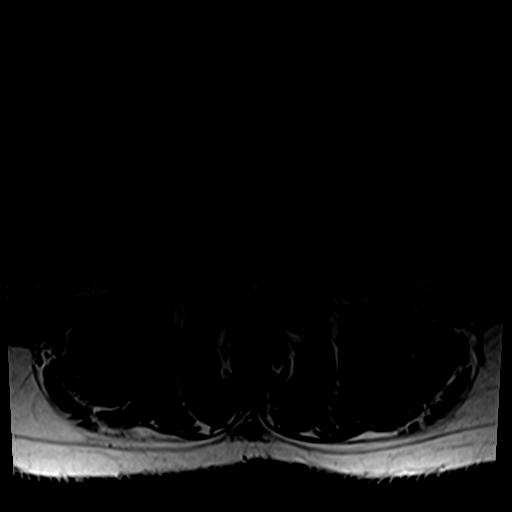
[im 22/42]
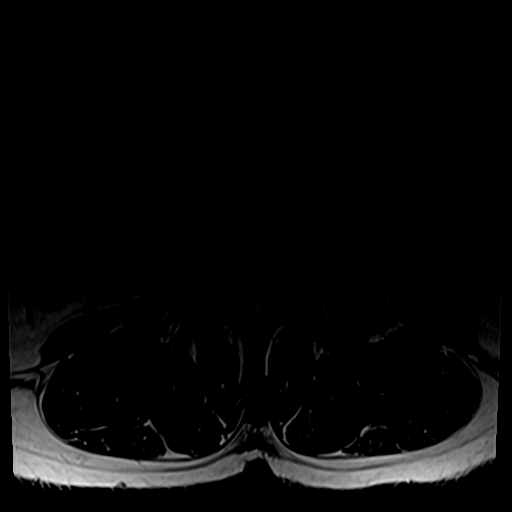
[im 36/42]
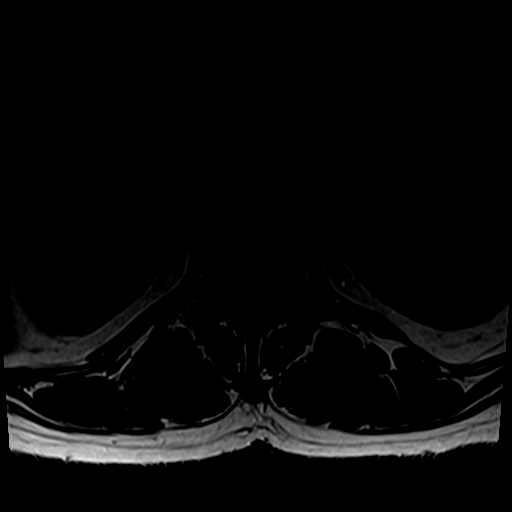

[26 of 48 positions shown; findings below may reference images not displayed]

FINDINGS: Segmentation: Transitional. L5 partially sacralized. T12 has small
ribs.

Alignment: 5 mm anterolisthesis L4 on L5 is facet mediated. No pars
defects.

Vertebrae:  No worrisome osseous lesions.

Conus medullaris and cauda equina: Conus extends to the L1. Level.
Conus and cauda equina appear normal.

Paraspinal and other soft tissues: Suspected gallstones. No
hydronephrosis.

Disc levels:

L1-L2:  With normal.

L2-L3: Normal disc space. Posterior element hypertrophy. Borderline
stenosis. No impingement.

L3-L4: Annular bulge. Posterior element hypertrophy. Mild stenosis.
No definite impingement.

L4-L5: 5 mm anterolisthesis. Uncovering of the disc with shallow
central and rightward protrusion. Posterior element hypertrophy
affecting facets and ligamentum flavum. Moderate to severe stenosis.
BILATERAL L4 and L5 nerve root impingement.

L5-S1:  Hypoplastic disc space.  No impingement.
IMPRESSION: Transitional anatomy.  See discussion above.  L5-S1 is hypoplastic.

Moderate to severe stenosis at L4-5 secondary to facet arthropathy,
ligamentum flavum hypertrophy, 5 mm anterolisthesis, with shallow
central and rightward protrusion. BILATERAL L4 and L5 nerve root
impingement.

## 2018-08-02 DIAGNOSIS — F4323 Adjustment disorder with mixed anxiety and depressed mood: Secondary | ICD-10-CM | POA: Diagnosis not present

## 2018-08-06 DIAGNOSIS — N941 Unspecified dyspareunia: Secondary | ICD-10-CM | POA: Diagnosis not present

## 2018-08-06 DIAGNOSIS — N952 Postmenopausal atrophic vaginitis: Secondary | ICD-10-CM | POA: Diagnosis not present

## 2018-08-07 DIAGNOSIS — L820 Inflamed seborrheic keratosis: Secondary | ICD-10-CM | POA: Diagnosis not present

## 2018-08-12 DIAGNOSIS — Z20828 Contact with and (suspected) exposure to other viral communicable diseases: Secondary | ICD-10-CM | POA: Diagnosis not present

## 2018-08-16 DIAGNOSIS — Z01411 Encounter for gynecological examination (general) (routine) with abnormal findings: Secondary | ICD-10-CM | POA: Diagnosis not present

## 2018-08-16 DIAGNOSIS — N644 Mastodynia: Secondary | ICD-10-CM | POA: Diagnosis not present

## 2018-08-16 DIAGNOSIS — N766 Ulceration of vulva: Secondary | ICD-10-CM | POA: Diagnosis not present

## 2018-08-17 ENCOUNTER — Other Ambulatory Visit: Payer: Self-pay | Admitting: Obstetrics and Gynecology

## 2018-08-17 DIAGNOSIS — N644 Mastodynia: Secondary | ICD-10-CM

## 2018-08-20 ENCOUNTER — Ambulatory Visit
Admission: RE | Admit: 2018-08-20 | Discharge: 2018-08-20 | Disposition: A | Discharge status: Home / Self Care | Payer: Medicare Other | Source: Ambulatory Visit | Attending: Obstetrics and Gynecology | Admitting: Obstetrics and Gynecology

## 2018-08-20 ENCOUNTER — Other Ambulatory Visit: Payer: Self-pay

## 2018-08-20 DIAGNOSIS — N644 Mastodynia: Secondary | ICD-10-CM

## 2018-08-20 DIAGNOSIS — N6489 Other specified disorders of breast: Secondary | ICD-10-CM | POA: Diagnosis not present

## 2018-08-20 DIAGNOSIS — R922 Inconclusive mammogram: Secondary | ICD-10-CM | POA: Diagnosis not present

## 2018-08-29 DIAGNOSIS — F4323 Adjustment disorder with mixed anxiety and depressed mood: Secondary | ICD-10-CM | POA: Diagnosis not present

## 2018-08-30 ENCOUNTER — Ambulatory Visit (INDEPENDENT_AMBULATORY_CARE_PROVIDER_SITE_OTHER): Payer: Medicare Other | Admitting: Family Medicine

## 2018-08-30 ENCOUNTER — Other Ambulatory Visit: Payer: Self-pay

## 2018-08-30 VITALS — BP 136/78 | Ht 65.0 in | Wt 135.0 lb

## 2018-08-30 DIAGNOSIS — M79641 Pain in right hand: Secondary | ICD-10-CM

## 2018-08-30 DIAGNOSIS — M79642 Pain in left hand: Secondary | ICD-10-CM | POA: Diagnosis not present

## 2018-08-30 MED ORDER — PREDNISONE 5 MG PO TABS: ORAL_TABLET | ORAL | 0 refills | Status: DC

## 2018-08-30 NOTE — Progress Notes (Signed)
Kara Gonzalez - 63 y.o. female MRN [id number]  Date of birth: 10-26-1955  SUBJECTIVE:  Including CC & ROS.  No chief complaint on file.   Kara Gonzalez is a 63 y.o. female that is presenting with bilateral hand pain and stiffness in the joints.  This is been ongoing over the past 2 weeks.  Is located in the PIP and DIP joints.  She denies any inciting event or trauma.  It is worse in the morning and better when she is moving her hands.  She has had little improvement with modalities to date.  Seems to be localized to the joints in the hands.  Does have some stiffness in the elbows and shoulders.    Review of Systems  Constitutional: Negative for fever.  HENT: Negative for congestion.   Respiratory: Negative for cough.   Cardiovascular: Negative for chest pain.  Gastrointestinal: Negative for abdominal pain.  Musculoskeletal: Positive for arthralgias.  Neurological: Negative for weakness.  Hematological: Negative for adenopathy.    HISTORY: Past Medical, Surgical, Social, and Family History Reviewed & Updated per EMR.   Pertinent Historical Findings include:  Past Medical History:  Diagnosis Date  . Thyroid disease     Past Surgical History:  Procedure Laterality Date  . APPENDECTOMY    . HERNIA REPAIR    . TUBAL LIGATION      No Known Allergies  No family history on file.   Social History   Socioeconomic History  . Marital status: Single    Spouse name: Not on file  . Number of children: Not on file  . Years of education: Not on file  . Highest education level: Not on file  Occupational History  . Not on file  Social Needs  . Financial resource strain: Not on file  . Food insecurity    Worry: Not on file    Inability: Not on file  . Transportation needs    Medical: Not on file    Non-medical: Not on file  Tobacco Use  . Smoking status: Never Smoker  . Smokeless tobacco: Never Used  Substance and Sexual Activity  . Alcohol use: Yes    Comment: 2  glasses of wine daily regularly  . Drug use: Not on file  . Sexual activity: Not on file  Lifestyle  . Physical activity    Days per week: Not on file    Minutes per session: Not on file  . Stress: Not on file  Relationships  . Social Herbalist on phone: Not on file    Gets together: Not on file    Attends religious service: Not on file    Active member of club or organization: Not on file    Attends meetings of clubs or organizations: Not on file    Relationship status: Not on file  . Intimate partner violence    Fear of current or ex partner: Not on file    Emotionally abused: Not on file    Physically abused: Not on file    Forced sexual activity: Not on file  Other Topics Concern  . Not on file  Social History Narrative  . Not on file     PHYSICAL EXAM:  VS: BP 136/78   Ht 5\' 5"  (1.651 m)   Wt 135 lb (61.2 kg)   BMI 22.47 kg/m  Physical Exam Gen: NAD, alert, cooperative with exam, well-appearing ENT: normal lips, normal nasal mucosa,  Eye: normal EOM, normal conjunctiva and lids  CV:  no edema, +2 pedal pulses   Resp: no accessory muscle use, non-labored,  Skin: no rashes, no areas of induration  Neuro: normal tone, normal sensation to touch Psych:  normal insight, alert and oriented MSK:  Left and right hand: Swelling of the DIP joints in each hand.  No redness appreciated.   No significant limitation in range of motion. Some Heberden's nodules appreciated. Neurovascular intact     ASSESSMENT & PLAN:   Pain in both hands Has been tested for uric acid and rheumatology panel that came back normal 2018.  More likely associated with osteoarthritis. -Prednisone -Counseled on supportive care. -Could consider naproxen.

## 2018-08-30 NOTE — Patient Instructions (Signed)
Good to see you Please try the prednisone  Please try epson salt on the hands  Please try over-the-counter Voltaren gel. Please send me a message in MyChart with any questions or updates.  Please see me back in 4 weeks.   --Dr. Raeford Razor

## 2018-08-31 DIAGNOSIS — M19049 Primary osteoarthritis, unspecified hand: Secondary | ICD-10-CM | POA: Insufficient documentation

## 2018-08-31 NOTE — Assessment & Plan Note (Signed)
Has been tested for uric acid and rheumatology panel that came back normal 2018.  More likely associated with osteoarthritis. -Prednisone -Counseled on supportive care. -Could consider naproxen.

## 2018-09-06 DIAGNOSIS — F4323 Adjustment disorder with mixed anxiety and depressed mood: Secondary | ICD-10-CM | POA: Diagnosis not present

## 2018-09-27 ENCOUNTER — Ambulatory Visit (INDEPENDENT_AMBULATORY_CARE_PROVIDER_SITE_OTHER): Payer: Medicare Other | Admitting: Family Medicine

## 2018-09-27 ENCOUNTER — Encounter: Payer: Self-pay | Admitting: Family Medicine

## 2018-09-27 ENCOUNTER — Other Ambulatory Visit: Payer: Self-pay

## 2018-09-27 DIAGNOSIS — M19042 Primary osteoarthritis, left hand: Secondary | ICD-10-CM

## 2018-09-27 DIAGNOSIS — M19041 Primary osteoarthritis, right hand: Secondary | ICD-10-CM | POA: Diagnosis not present

## 2018-09-27 MED ORDER — RAYOS 5 MG PO TBEC: 5.0000 mg | DELAYED_RELEASE_TABLET | Freq: Every day | ORAL | 0 refills | Status: AC

## 2018-09-27 NOTE — Assessment & Plan Note (Signed)
Pain seems to be related to degenerative changes. Has had improvement with prednisone.  - rayos and samples provided - counseled on supportive care - could consider imaging if no improvement.

## 2018-09-27 NOTE — Patient Instructions (Signed)
Good to see you Pleas try the Rayos at night  Please try tumeric  Please try ice as needed  Please send me a message in MyChart with any questions or updates.  Please see me back in 2-3 months.   --Dr. Raeford Razor

## 2018-09-27 NOTE — Progress Notes (Signed)
Medication Samples have been provided to the patient.  Drug name: Rayos      Strength: 5mg         Qty: 2 Boxes  LOT: [id number]  Exp.Date: 05/2019  Dosing instructions: Take 1 tablet at bedtime  The patient has been instructed regarding the correct time, dose, and frequency of taking this medication, including desired effects and most common side effects.   Sherrie George, MA 3:52 PM 09/27/2018

## 2018-09-27 NOTE — Progress Notes (Signed)
Kara Gonzalez - 63 y.o. female MRN [id number]  Date of birth: Mar 10, 1955  SUBJECTIVE:  Including CC & ROS.  Chief Complaint  Patient presents with  . Follow-up    follow up for bilateral hand    Kara Gonzalez is a 63 y.o. female that is following up for her bilateral hand pain. Was prescribed prednisone and had improvement of her symptoms. Lab testing has been negative thus far. Has been taking pain medication chronically. She wants to wean off of this. Pain is worse in the morning. She feels it in her PIP and DIP joints.    Review of Systems  Constitutional: Negative for fever.  HENT: Negative for congestion.   Respiratory: Negative for cough.   Cardiovascular: Negative for chest pain.  Gastrointestinal: Negative for abdominal pain.  Musculoskeletal: Positive for arthralgias and back pain.  Skin: Negative for color change.  Neurological: Negative for weakness.  Hematological: Negative for adenopathy.    HISTORY: Past Medical, Surgical, Social, and Family History Reviewed & Updated per EMR.   Pertinent Historical Findings include:  Past Medical History:  Diagnosis Date  . Thyroid disease     Past Surgical History:  Procedure Laterality Date  . APPENDECTOMY    . HERNIA REPAIR    . TUBAL LIGATION      No Known Allergies  No family history on file.   Social History   Socioeconomic History  . Marital status: Single    Spouse name: Not on file  . Number of children: Not on file  . Years of education: Not on file  . Highest education level: Not on file  Occupational History  . Not on file  Social Needs  . Financial resource strain: Not on file  . Food insecurity    Worry: Not on file    Inability: Not on file  . Transportation needs    Medical: Not on file    Non-medical: Not on file  Tobacco Use  . Smoking status: Never Smoker  . Smokeless tobacco: Never Used  Substance and Sexual Activity  . Alcohol use: Yes    Comment: 2 glasses of wine daily  regularly  . Drug use: Not on file  . Sexual activity: Not on file  Lifestyle  . Physical activity    Days per week: Not on file    Minutes per session: Not on file  . Stress: Not on file  Relationships  . Social Herbalist on phone: Not on file    Gets together: Not on file    Attends religious service: Not on file    Active member of club or organization: Not on file    Attends meetings of clubs or organizations: Not on file    Relationship status: Not on file  . Intimate partner violence    Fear of current or ex partner: Not on file    Emotionally abused: Not on file    Physically abused: Not on file    Forced sexual activity: Not on file  Other Topics Concern  . Not on file  Social History Narrative  . Not on file     PHYSICAL EXAM:  VS: BP 107/71   Ht 5\' 5"  (1.651 m)   Wt 160 lb (72.6 kg)   BMI 26.63 kg/m  Physical Exam Gen: NAD, alert, cooperative with exam, well-appearing ENT: normal lips, normal nasal mucosa,  Eye: normal EOM, normal conjunctiva and lids CV:  no edema, +2 pedal pulses   Resp:  no accessory muscle use, non-labored,  Skin: no rashes, no areas of induration  Neuro: normal tone, normal sensation to touch Psych:  normal insight, alert and oriented MSK:  Left and right hand:  Degenerative changes of the DIP and PIP on each hand.  Normal finger strength to resistance with adduction and abduction  Normal grip strength  NVI       ASSESSMENT & PLAN:   Osteoarthritis, hand Pain seems to be related to degenerative changes. Has had improvement with prednisone.  - rayos and samples provided - counseled on supportive care - could consider imaging if no improvement.

## 2018-11-12 DIAGNOSIS — M545 Low back pain: Secondary | ICD-10-CM | POA: Diagnosis not present

## 2018-11-14 DIAGNOSIS — G8929 Other chronic pain: Secondary | ICD-10-CM | POA: Diagnosis present

## 2018-11-14 DIAGNOSIS — S22081D Stable burst fracture of T11-T12 vertebra, subsequent encounter for fracture with routine healing: Secondary | ICD-10-CM | POA: Diagnosis not present

## 2018-11-14 DIAGNOSIS — Z1159 Encounter for screening for other viral diseases: Secondary | ICD-10-CM | POA: Diagnosis not present

## 2018-11-14 DIAGNOSIS — S99912A Unspecified injury of left ankle, initial encounter: Secondary | ICD-10-CM | POA: Diagnosis not present

## 2018-11-14 DIAGNOSIS — M4804 Spinal stenosis, thoracic region: Secondary | ICD-10-CM | POA: Diagnosis present

## 2018-11-14 DIAGNOSIS — M4805 Spinal stenosis, thoracolumbar region: Secondary | ICD-10-CM | POA: Diagnosis not present

## 2018-11-14 DIAGNOSIS — S32019A Unspecified fracture of first lumbar vertebra, initial encounter for closed fracture: Secondary | ICD-10-CM | POA: Diagnosis not present

## 2018-11-14 DIAGNOSIS — G4700 Insomnia, unspecified: Secondary | ICD-10-CM | POA: Diagnosis present

## 2018-11-14 DIAGNOSIS — S92012A Displaced fracture of body of left calcaneus, initial encounter for closed fracture: Secondary | ICD-10-CM | POA: Diagnosis not present

## 2018-11-14 DIAGNOSIS — Z0389 Encounter for observation for other suspected diseases and conditions ruled out: Secondary | ICD-10-CM | POA: Diagnosis not present

## 2018-11-14 DIAGNOSIS — S32011D Stable burst fracture of first lumbar vertebra, subsequent encounter for fracture with routine healing: Secondary | ICD-10-CM | POA: Diagnosis not present

## 2018-11-14 DIAGNOSIS — G8911 Acute pain due to trauma: Secondary | ICD-10-CM | POA: Diagnosis not present

## 2018-11-14 DIAGNOSIS — S32018A Other fracture of first lumbar vertebra, initial encounter for closed fracture: Secondary | ICD-10-CM | POA: Diagnosis not present

## 2018-11-14 DIAGNOSIS — S34139D Unspecified injury to sacral spinal cord, subsequent encounter: Secondary | ICD-10-CM | POA: Diagnosis not present

## 2018-11-14 DIAGNOSIS — S32029A Unspecified fracture of second lumbar vertebra, initial encounter for closed fracture: Secondary | ICD-10-CM | POA: Diagnosis not present

## 2018-11-14 DIAGNOSIS — S32029D Unspecified fracture of second lumbar vertebra, subsequent encounter for fracture with routine healing: Secondary | ICD-10-CM | POA: Diagnosis not present

## 2018-11-14 DIAGNOSIS — F419 Anxiety disorder, unspecified: Secondary | ICD-10-CM | POA: Diagnosis not present

## 2018-11-14 DIAGNOSIS — S32028A Other fracture of second lumbar vertebra, initial encounter for closed fracture: Secondary | ICD-10-CM | POA: Diagnosis not present

## 2018-11-14 DIAGNOSIS — M25571 Pain in right ankle and joints of right foot: Secondary | ICD-10-CM | POA: Diagnosis not present

## 2018-11-14 DIAGNOSIS — S92001A Unspecified fracture of right calcaneus, initial encounter for closed fracture: Secondary | ICD-10-CM | POA: Diagnosis not present

## 2018-11-14 DIAGNOSIS — M549 Dorsalgia, unspecified: Secondary | ICD-10-CM | POA: Diagnosis not present

## 2018-11-14 DIAGNOSIS — W19XXXA Unspecified fall, initial encounter: Secondary | ICD-10-CM | POA: Diagnosis not present

## 2018-11-14 DIAGNOSIS — S22089D Unspecified fracture of T11-T12 vertebra, subsequent encounter for fracture with routine healing: Secondary | ICD-10-CM | POA: Diagnosis not present

## 2018-11-14 DIAGNOSIS — S32009A Unspecified fracture of unspecified lumbar vertebra, initial encounter for closed fracture: Secondary | ICD-10-CM | POA: Diagnosis not present

## 2018-11-14 DIAGNOSIS — S22082A Unstable burst fracture of T11-T12 vertebra, initial encounter for closed fracture: Secondary | ICD-10-CM | POA: Diagnosis not present

## 2018-11-14 DIAGNOSIS — E039 Hypothyroidism, unspecified: Secondary | ICD-10-CM | POA: Diagnosis present

## 2018-11-14 DIAGNOSIS — S8991XA Unspecified injury of right lower leg, initial encounter: Secondary | ICD-10-CM | POA: Diagnosis not present

## 2018-11-14 DIAGNOSIS — S82451A Displaced comminuted fracture of shaft of right fibula, initial encounter for closed fracture: Secondary | ICD-10-CM | POA: Diagnosis not present

## 2018-11-14 DIAGNOSIS — F329 Major depressive disorder, single episode, unspecified: Secondary | ICD-10-CM | POA: Diagnosis not present

## 2018-11-14 DIAGNOSIS — S8261XA Displaced fracture of lateral malleolus of right fibula, initial encounter for closed fracture: Secondary | ICD-10-CM | POA: Diagnosis not present

## 2018-11-14 DIAGNOSIS — S92011A Displaced fracture of body of right calcaneus, initial encounter for closed fracture: Secondary | ICD-10-CM | POA: Diagnosis not present

## 2018-11-14 DIAGNOSIS — S92009A Unspecified fracture of unspecified calcaneus, initial encounter for closed fracture: Secondary | ICD-10-CM | POA: Diagnosis not present

## 2018-11-14 DIAGNOSIS — R339 Retention of urine, unspecified: Secondary | ICD-10-CM | POA: Diagnosis not present

## 2018-11-14 DIAGNOSIS — R2689 Other abnormalities of gait and mobility: Secondary | ICD-10-CM | POA: Diagnosis not present

## 2018-11-14 DIAGNOSIS — S92021A Displaced fracture of anterior process of right calcaneus, initial encounter for closed fracture: Secondary | ICD-10-CM | POA: Diagnosis not present

## 2018-11-14 DIAGNOSIS — S0990XA Unspecified injury of head, initial encounter: Secondary | ICD-10-CM | POA: Diagnosis not present

## 2018-11-14 DIAGNOSIS — S92002A Unspecified fracture of left calcaneus, initial encounter for closed fracture: Secondary | ICD-10-CM | POA: Diagnosis not present

## 2018-11-14 DIAGNOSIS — M5430 Sciatica, unspecified side: Secondary | ICD-10-CM | POA: Diagnosis present

## 2018-11-14 DIAGNOSIS — M48061 Spinal stenosis, lumbar region without neurogenic claudication: Secondary | ICD-10-CM | POA: Diagnosis present

## 2018-11-14 DIAGNOSIS — S8264XA Nondisplaced fracture of lateral malleolus of right fibula, initial encounter for closed fracture: Secondary | ICD-10-CM | POA: Diagnosis present

## 2018-11-14 DIAGNOSIS — D62 Acute posthemorrhagic anemia: Secondary | ICD-10-CM | POA: Diagnosis not present

## 2018-11-14 DIAGNOSIS — S22088A Other fracture of T11-T12 vertebra, initial encounter for closed fracture: Secondary | ICD-10-CM | POA: Diagnosis not present

## 2018-11-14 DIAGNOSIS — Z79890 Hormone replacement therapy (postmenopausal): Secondary | ICD-10-CM | POA: Diagnosis not present

## 2018-11-14 DIAGNOSIS — R079 Chest pain, unspecified: Secondary | ICD-10-CM | POA: Diagnosis not present

## 2018-11-14 DIAGNOSIS — Z7409 Other reduced mobility: Secondary | ICD-10-CM | POA: Diagnosis not present

## 2018-11-14 DIAGNOSIS — Z79899 Other long term (current) drug therapy: Secondary | ICD-10-CM | POA: Diagnosis not present

## 2018-11-14 DIAGNOSIS — S22081A Stable burst fracture of T11-T12 vertebra, initial encounter for closed fracture: Secondary | ICD-10-CM | POA: Diagnosis not present

## 2018-11-14 DIAGNOSIS — S22088D Other fracture of T11-T12 vertebra, subsequent encounter for fracture with routine healing: Secondary | ICD-10-CM | POA: Diagnosis not present

## 2018-11-14 DIAGNOSIS — S92025A Nondisplaced fracture of anterior process of left calcaneus, initial encounter for closed fracture: Secondary | ICD-10-CM | POA: Diagnosis not present

## 2018-11-14 DIAGNOSIS — T1490XA Injury, unspecified, initial encounter: Secondary | ICD-10-CM | POA: Diagnosis not present

## 2018-11-14 DIAGNOSIS — Z043 Encounter for examination and observation following other accident: Secondary | ICD-10-CM | POA: Diagnosis not present

## 2018-11-20 DIAGNOSIS — R339 Retention of urine, unspecified: Secondary | ICD-10-CM | POA: Diagnosis not present

## 2018-11-20 DIAGNOSIS — R402412 Glasgow coma scale score 13-15, at arrival to emergency department: Secondary | ICD-10-CM | POA: Diagnosis present

## 2018-11-20 DIAGNOSIS — G8911 Acute pain due to trauma: Secondary | ICD-10-CM | POA: Diagnosis not present

## 2018-11-20 DIAGNOSIS — S92009A Unspecified fracture of unspecified calcaneus, initial encounter for closed fracture: Secondary | ICD-10-CM | POA: Diagnosis not present

## 2018-11-20 DIAGNOSIS — M549 Dorsalgia, unspecified: Secondary | ICD-10-CM | POA: Diagnosis not present

## 2018-11-20 DIAGNOSIS — R0789 Other chest pain: Secondary | ICD-10-CM | POA: Diagnosis not present

## 2018-11-20 DIAGNOSIS — S32011D Stable burst fracture of first lumbar vertebra, subsequent encounter for fracture with routine healing: Secondary | ICD-10-CM | POA: Diagnosis not present

## 2018-11-20 DIAGNOSIS — R42 Dizziness and giddiness: Secondary | ICD-10-CM | POA: Diagnosis present

## 2018-11-20 DIAGNOSIS — Z7401 Bed confinement status: Secondary | ICD-10-CM | POA: Diagnosis not present

## 2018-11-20 DIAGNOSIS — S32019A Unspecified fracture of first lumbar vertebra, initial encounter for closed fracture: Secondary | ICD-10-CM | POA: Diagnosis not present

## 2018-11-20 DIAGNOSIS — S22089D Unspecified fracture of T11-T12 vertebra, subsequent encounter for fracture with routine healing: Secondary | ICD-10-CM | POA: Diagnosis not present

## 2018-11-20 DIAGNOSIS — S22088D Other fracture of T11-T12 vertebra, subsequent encounter for fracture with routine healing: Secondary | ICD-10-CM | POA: Diagnosis not present

## 2018-11-20 DIAGNOSIS — G4700 Insomnia, unspecified: Secondary | ICD-10-CM | POA: Diagnosis not present

## 2018-11-20 DIAGNOSIS — M25571 Pain in right ankle and joints of right foot: Secondary | ICD-10-CM | POA: Diagnosis not present

## 2018-11-20 DIAGNOSIS — D649 Anemia, unspecified: Secondary | ICD-10-CM | POA: Diagnosis not present

## 2018-11-20 DIAGNOSIS — R338 Other retention of urine: Secondary | ICD-10-CM | POA: Diagnosis not present

## 2018-11-20 DIAGNOSIS — S82891D Other fracture of right lower leg, subsequent encounter for closed fracture with routine healing: Secondary | ICD-10-CM | POA: Diagnosis not present

## 2018-11-20 DIAGNOSIS — S32009A Unspecified fracture of unspecified lumbar vertebra, initial encounter for closed fracture: Secondary | ICD-10-CM | POA: Diagnosis not present

## 2018-11-20 DIAGNOSIS — D62 Acute posthemorrhagic anemia: Secondary | ICD-10-CM | POA: Diagnosis present

## 2018-11-20 DIAGNOSIS — S22081D Stable burst fracture of T11-T12 vertebra, subsequent encounter for fracture with routine healing: Secondary | ICD-10-CM | POA: Diagnosis not present

## 2018-11-20 DIAGNOSIS — S82891A Other fracture of right lower leg, initial encounter for closed fracture: Secondary | ICD-10-CM | POA: Diagnosis not present

## 2018-11-20 DIAGNOSIS — M25572 Pain in left ankle and joints of left foot: Secondary | ICD-10-CM | POA: Diagnosis not present

## 2018-11-20 DIAGNOSIS — M79672 Pain in left foot: Secondary | ICD-10-CM | POA: Diagnosis not present

## 2018-11-20 DIAGNOSIS — S92002A Unspecified fracture of left calcaneus, initial encounter for closed fracture: Secondary | ICD-10-CM | POA: Diagnosis not present

## 2018-11-20 DIAGNOSIS — Z0389 Encounter for observation for other suspected diseases and conditions ruled out: Secondary | ICD-10-CM | POA: Diagnosis not present

## 2018-11-20 DIAGNOSIS — M545 Low back pain: Secondary | ICD-10-CM | POA: Diagnosis present

## 2018-11-20 DIAGNOSIS — R21 Rash and other nonspecific skin eruption: Secondary | ICD-10-CM | POA: Diagnosis present

## 2018-11-20 DIAGNOSIS — F419 Anxiety disorder, unspecified: Secondary | ICD-10-CM | POA: Diagnosis not present

## 2018-11-20 DIAGNOSIS — S0101XD Laceration without foreign body of scalp, subsequent encounter: Secondary | ICD-10-CM | POA: Diagnosis not present

## 2018-11-20 DIAGNOSIS — S32029D Unspecified fracture of second lumbar vertebra, subsequent encounter for fracture with routine healing: Secondary | ICD-10-CM | POA: Diagnosis not present

## 2018-11-20 DIAGNOSIS — T1490XA Injury, unspecified, initial encounter: Secondary | ICD-10-CM | POA: Diagnosis not present

## 2018-11-20 DIAGNOSIS — G8929 Other chronic pain: Secondary | ICD-10-CM | POA: Diagnosis not present

## 2018-11-20 DIAGNOSIS — M4800 Spinal stenosis, site unspecified: Secondary | ICD-10-CM | POA: Diagnosis not present

## 2018-11-20 DIAGNOSIS — S22089A Unspecified fracture of T11-T12 vertebra, initial encounter for closed fracture: Secondary | ICD-10-CM | POA: Diagnosis not present

## 2018-11-20 DIAGNOSIS — M79671 Pain in right foot: Secondary | ICD-10-CM | POA: Diagnosis not present

## 2018-11-20 DIAGNOSIS — Z602 Problems related to living alone: Secondary | ICD-10-CM | POA: Diagnosis present

## 2018-11-20 DIAGNOSIS — W19XXXA Unspecified fall, initial encounter: Secondary | ICD-10-CM | POA: Diagnosis not present

## 2018-11-20 DIAGNOSIS — F329 Major depressive disorder, single episode, unspecified: Secondary | ICD-10-CM | POA: Diagnosis not present

## 2018-11-20 DIAGNOSIS — S8261XD Displaced fracture of lateral malleolus of right fibula, subsequent encounter for closed fracture with routine healing: Secondary | ICD-10-CM | POA: Diagnosis not present

## 2018-11-20 DIAGNOSIS — S92002D Unspecified fracture of left calcaneus, subsequent encounter for fracture with routine healing: Secondary | ICD-10-CM | POA: Diagnosis not present

## 2018-11-20 DIAGNOSIS — F5101 Primary insomnia: Secondary | ICD-10-CM | POA: Diagnosis not present

## 2018-11-20 DIAGNOSIS — E039 Hypothyroidism, unspecified: Secondary | ICD-10-CM | POA: Diagnosis present

## 2018-11-20 DIAGNOSIS — S34139D Unspecified injury to sacral spinal cord, subsequent encounter: Secondary | ICD-10-CM | POA: Diagnosis not present

## 2018-11-20 DIAGNOSIS — F411 Generalized anxiety disorder: Secondary | ICD-10-CM | POA: Diagnosis not present

## 2018-11-20 DIAGNOSIS — F3341 Major depressive disorder, recurrent, in partial remission: Secondary | ICD-10-CM | POA: Diagnosis not present

## 2018-11-20 DIAGNOSIS — S92001D Unspecified fracture of right calcaneus, subsequent encounter for fracture with routine healing: Secondary | ICD-10-CM | POA: Diagnosis not present

## 2018-11-20 DIAGNOSIS — S9304XD Dislocation of right ankle joint, subsequent encounter: Secondary | ICD-10-CM | POA: Diagnosis not present

## 2018-11-20 DIAGNOSIS — R7989 Other specified abnormal findings of blood chemistry: Secondary | ICD-10-CM | POA: Diagnosis not present

## 2018-11-20 DIAGNOSIS — Z7409 Other reduced mobility: Secondary | ICD-10-CM | POA: Diagnosis not present

## 2018-11-20 DIAGNOSIS — R3911 Hesitancy of micturition: Secondary | ICD-10-CM | POA: Diagnosis present

## 2018-11-20 DIAGNOSIS — S92001A Unspecified fracture of right calcaneus, initial encounter for closed fracture: Secondary | ICD-10-CM | POA: Diagnosis not present

## 2018-11-22 DIAGNOSIS — M25571 Pain in right ankle and joints of right foot: Secondary | ICD-10-CM | POA: Diagnosis not present

## 2018-11-22 DIAGNOSIS — M25572 Pain in left ankle and joints of left foot: Secondary | ICD-10-CM | POA: Diagnosis not present

## 2018-11-22 DIAGNOSIS — M79671 Pain in right foot: Secondary | ICD-10-CM | POA: Diagnosis not present

## 2018-11-22 DIAGNOSIS — M79672 Pain in left foot: Secondary | ICD-10-CM | POA: Diagnosis not present

## 2018-11-27 DIAGNOSIS — F411 Generalized anxiety disorder: Secondary | ICD-10-CM | POA: Diagnosis not present

## 2018-11-27 DIAGNOSIS — F5101 Primary insomnia: Secondary | ICD-10-CM | POA: Diagnosis not present

## 2018-11-27 DIAGNOSIS — F3341 Major depressive disorder, recurrent, in partial remission: Secondary | ICD-10-CM | POA: Diagnosis not present

## 2018-11-29 DIAGNOSIS — M25571 Pain in right ankle and joints of right foot: Secondary | ICD-10-CM | POA: Diagnosis not present

## 2018-11-29 DIAGNOSIS — M25572 Pain in left ankle and joints of left foot: Secondary | ICD-10-CM | POA: Diagnosis not present

## 2018-12-05 DIAGNOSIS — G8918 Other acute postprocedural pain: Secondary | ICD-10-CM | POA: Diagnosis not present

## 2018-12-05 DIAGNOSIS — Z885 Allergy status to narcotic agent status: Secondary | ICD-10-CM | POA: Diagnosis not present

## 2018-12-05 DIAGNOSIS — S93315A Dislocation of tarsal joint of left foot, initial encounter: Secondary | ICD-10-CM | POA: Diagnosis not present

## 2018-12-05 DIAGNOSIS — S93314A Dislocation of tarsal joint of right foot, initial encounter: Secondary | ICD-10-CM | POA: Diagnosis not present

## 2018-12-05 DIAGNOSIS — S92002A Unspecified fracture of left calcaneus, initial encounter for closed fracture: Secondary | ICD-10-CM | POA: Diagnosis not present

## 2018-12-05 DIAGNOSIS — Z79899 Other long term (current) drug therapy: Secondary | ICD-10-CM | POA: Diagnosis not present

## 2018-12-05 DIAGNOSIS — E079 Disorder of thyroid, unspecified: Secondary | ICD-10-CM | POA: Diagnosis not present

## 2018-12-05 DIAGNOSIS — Z981 Arthrodesis status: Secondary | ICD-10-CM | POA: Diagnosis not present

## 2018-12-05 DIAGNOSIS — S92001A Unspecified fracture of right calcaneus, initial encounter for closed fracture: Secondary | ICD-10-CM | POA: Diagnosis not present

## 2018-12-05 DIAGNOSIS — S8261XA Displaced fracture of lateral malleolus of right fibula, initial encounter for closed fracture: Secondary | ICD-10-CM | POA: Diagnosis not present

## 2018-12-05 DIAGNOSIS — Z79891 Long term (current) use of opiate analgesic: Secondary | ICD-10-CM | POA: Diagnosis not present

## 2018-12-07 DIAGNOSIS — F329 Major depressive disorder, single episode, unspecified: Secondary | ICD-10-CM | POA: Diagnosis not present

## 2018-12-07 DIAGNOSIS — M48061 Spinal stenosis, lumbar region without neurogenic claudication: Secondary | ICD-10-CM | POA: Diagnosis not present

## 2018-12-07 DIAGNOSIS — E039 Hypothyroidism, unspecified: Secondary | ICD-10-CM | POA: Diagnosis not present

## 2018-12-07 DIAGNOSIS — T07XXXD Unspecified multiple injuries, subsequent encounter: Secondary | ICD-10-CM | POA: Diagnosis not present

## 2018-12-07 DIAGNOSIS — F419 Anxiety disorder, unspecified: Secondary | ICD-10-CM | POA: Diagnosis not present

## 2018-12-10 DIAGNOSIS — R945 Abnormal results of liver function studies: Secondary | ICD-10-CM | POA: Diagnosis not present

## 2018-12-18 DIAGNOSIS — M79672 Pain in left foot: Secondary | ICD-10-CM | POA: Diagnosis not present

## 2018-12-18 DIAGNOSIS — M79671 Pain in right foot: Secondary | ICD-10-CM | POA: Diagnosis not present

## 2018-12-27 DIAGNOSIS — M48061 Spinal stenosis, lumbar region without neurogenic claudication: Secondary | ICD-10-CM | POA: Diagnosis not present

## 2018-12-28 DIAGNOSIS — S92002D Unspecified fracture of left calcaneus, subsequent encounter for fracture with routine healing: Secondary | ICD-10-CM | POA: Diagnosis not present

## 2018-12-28 DIAGNOSIS — Z79899 Other long term (current) drug therapy: Secondary | ICD-10-CM | POA: Diagnosis not present

## 2018-12-28 DIAGNOSIS — S22081D Stable burst fracture of T11-T12 vertebra, subsequent encounter for fracture with routine healing: Secondary | ICD-10-CM | POA: Diagnosis not present

## 2018-12-28 DIAGNOSIS — M48061 Spinal stenosis, lumbar region without neurogenic claudication: Secondary | ICD-10-CM | POA: Diagnosis not present

## 2018-12-28 DIAGNOSIS — Z79891 Long term (current) use of opiate analgesic: Secondary | ICD-10-CM | POA: Diagnosis not present

## 2018-12-28 DIAGNOSIS — Z87891 Personal history of nicotine dependence: Secondary | ICD-10-CM | POA: Diagnosis not present

## 2018-12-28 DIAGNOSIS — S8261XD Displaced fracture of lateral malleolus of right fibula, subsequent encounter for closed fracture with routine healing: Secondary | ICD-10-CM | POA: Diagnosis not present

## 2018-12-28 DIAGNOSIS — Z5181 Encounter for therapeutic drug level monitoring: Secondary | ICD-10-CM | POA: Diagnosis not present

## 2018-12-28 DIAGNOSIS — G8929 Other chronic pain: Secondary | ICD-10-CM | POA: Diagnosis not present

## 2018-12-28 DIAGNOSIS — S32019D Unspecified fracture of first lumbar vertebra, subsequent encounter for fracture with routine healing: Secondary | ICD-10-CM | POA: Diagnosis not present

## 2018-12-28 DIAGNOSIS — S32029D Unspecified fracture of second lumbar vertebra, subsequent encounter for fracture with routine healing: Secondary | ICD-10-CM | POA: Diagnosis not present

## 2019-01-24 DIAGNOSIS — Z87891 Personal history of nicotine dependence: Secondary | ICD-10-CM | POA: Diagnosis not present

## 2019-01-24 DIAGNOSIS — M48061 Spinal stenosis, lumbar region without neurogenic claudication: Secondary | ICD-10-CM | POA: Diagnosis not present

## 2019-01-24 DIAGNOSIS — S92009D Unspecified fracture of unspecified calcaneus, subsequent encounter for fracture with routine healing: Secondary | ICD-10-CM | POA: Diagnosis not present

## 2019-01-24 DIAGNOSIS — S82891D Other fracture of right lower leg, subsequent encounter for closed fracture with routine healing: Secondary | ICD-10-CM | POA: Diagnosis not present

## 2019-01-24 DIAGNOSIS — Z79899 Other long term (current) drug therapy: Secondary | ICD-10-CM | POA: Diagnosis not present

## 2019-01-24 DIAGNOSIS — Z981 Arthrodesis status: Secondary | ICD-10-CM | POA: Diagnosis not present

## 2019-01-24 DIAGNOSIS — G8929 Other chronic pain: Secondary | ICD-10-CM | POA: Diagnosis not present

## 2019-02-22 DIAGNOSIS — Z791 Long term (current) use of non-steroidal anti-inflammatories (NSAID): Secondary | ICD-10-CM | POA: Diagnosis not present

## 2019-02-22 DIAGNOSIS — Z87891 Personal history of nicotine dependence: Secondary | ICD-10-CM | POA: Diagnosis not present

## 2019-02-22 DIAGNOSIS — Z79899 Other long term (current) drug therapy: Secondary | ICD-10-CM | POA: Diagnosis not present

## 2019-02-22 DIAGNOSIS — G8929 Other chronic pain: Secondary | ICD-10-CM | POA: Diagnosis not present

## 2019-02-22 DIAGNOSIS — Z79891 Long term (current) use of opiate analgesic: Secondary | ICD-10-CM | POA: Diagnosis not present

## 2019-02-22 DIAGNOSIS — Z5181 Encounter for therapeutic drug level monitoring: Secondary | ICD-10-CM | POA: Diagnosis not present

## 2019-02-22 DIAGNOSIS — S8261XD Displaced fracture of lateral malleolus of right fibula, subsequent encounter for closed fracture with routine healing: Secondary | ICD-10-CM | POA: Diagnosis not present

## 2019-02-22 DIAGNOSIS — S92002D Unspecified fracture of left calcaneus, subsequent encounter for fracture with routine healing: Secondary | ICD-10-CM | POA: Diagnosis not present

## 2019-02-22 DIAGNOSIS — S92001D Unspecified fracture of right calcaneus, subsequent encounter for fracture with routine healing: Secondary | ICD-10-CM | POA: Diagnosis not present

## 2019-02-25 DIAGNOSIS — H25813 Combined forms of age-related cataract, bilateral: Secondary | ICD-10-CM | POA: Diagnosis not present

## 2019-02-25 DIAGNOSIS — H04123 Dry eye syndrome of bilateral lacrimal glands: Secondary | ICD-10-CM | POA: Diagnosis not present

## 2019-02-26 DIAGNOSIS — M25571 Pain in right ankle and joints of right foot: Secondary | ICD-10-CM | POA: Diagnosis not present

## 2019-02-26 DIAGNOSIS — M25572 Pain in left ankle and joints of left foot: Secondary | ICD-10-CM | POA: Diagnosis not present

## 2019-02-26 DIAGNOSIS — S92011S Displaced fracture of body of right calcaneus, sequela: Secondary | ICD-10-CM | POA: Diagnosis not present

## 2019-02-26 DIAGNOSIS — M79672 Pain in left foot: Secondary | ICD-10-CM | POA: Diagnosis not present

## 2019-02-26 DIAGNOSIS — M79671 Pain in right foot: Secondary | ICD-10-CM | POA: Diagnosis not present

## 2019-02-26 DIAGNOSIS — S92012S Displaced fracture of body of left calcaneus, sequela: Secondary | ICD-10-CM | POA: Diagnosis not present

## 2019-03-13 DIAGNOSIS — M25672 Stiffness of left ankle, not elsewhere classified: Secondary | ICD-10-CM | POA: Diagnosis not present

## 2019-03-13 DIAGNOSIS — M25671 Stiffness of right ankle, not elsewhere classified: Secondary | ICD-10-CM | POA: Diagnosis not present

## 2019-03-13 DIAGNOSIS — M79671 Pain in right foot: Secondary | ICD-10-CM | POA: Diagnosis not present

## 2019-03-13 DIAGNOSIS — M79672 Pain in left foot: Secondary | ICD-10-CM | POA: Diagnosis not present

## 2019-03-26 DIAGNOSIS — M48061 Spinal stenosis, lumbar region without neurogenic claudication: Secondary | ICD-10-CM | POA: Diagnosis not present

## 2019-03-27 DIAGNOSIS — M25672 Stiffness of left ankle, not elsewhere classified: Secondary | ICD-10-CM | POA: Diagnosis not present

## 2019-03-27 DIAGNOSIS — M25671 Stiffness of right ankle, not elsewhere classified: Secondary | ICD-10-CM | POA: Diagnosis not present

## 2019-03-29 DIAGNOSIS — M25571 Pain in right ankle and joints of right foot: Secondary | ICD-10-CM | POA: Diagnosis not present

## 2019-03-29 DIAGNOSIS — Z79891 Long term (current) use of opiate analgesic: Secondary | ICD-10-CM | POA: Diagnosis not present

## 2019-03-29 DIAGNOSIS — S92002D Unspecified fracture of left calcaneus, subsequent encounter for fracture with routine healing: Secondary | ICD-10-CM | POA: Diagnosis not present

## 2019-03-29 DIAGNOSIS — S92001D Unspecified fracture of right calcaneus, subsequent encounter for fracture with routine healing: Secondary | ICD-10-CM | POA: Diagnosis not present

## 2019-03-29 DIAGNOSIS — Z87891 Personal history of nicotine dependence: Secondary | ICD-10-CM | POA: Diagnosis not present

## 2019-03-29 DIAGNOSIS — Z8781 Personal history of (healed) traumatic fracture: Secondary | ICD-10-CM | POA: Diagnosis not present

## 2019-03-29 DIAGNOSIS — Z79899 Other long term (current) drug therapy: Secondary | ICD-10-CM | POA: Diagnosis not present

## 2019-03-29 DIAGNOSIS — Z981 Arthrodesis status: Secondary | ICD-10-CM | POA: Diagnosis not present

## 2019-03-29 DIAGNOSIS — M25572 Pain in left ankle and joints of left foot: Secondary | ICD-10-CM | POA: Diagnosis not present

## 2019-03-29 DIAGNOSIS — G8929 Other chronic pain: Secondary | ICD-10-CM | POA: Diagnosis not present

## 2019-03-29 DIAGNOSIS — M48061 Spinal stenosis, lumbar region without neurogenic claudication: Secondary | ICD-10-CM | POA: Diagnosis not present

## 2019-03-29 DIAGNOSIS — M792 Neuralgia and neuritis, unspecified: Secondary | ICD-10-CM | POA: Diagnosis not present

## 2019-04-03 DIAGNOSIS — M6281 Muscle weakness (generalized): Secondary | ICD-10-CM | POA: Diagnosis not present

## 2019-04-03 DIAGNOSIS — M79671 Pain in right foot: Secondary | ICD-10-CM | POA: Diagnosis not present

## 2019-04-03 DIAGNOSIS — M79672 Pain in left foot: Secondary | ICD-10-CM | POA: Diagnosis not present

## 2019-04-05 DIAGNOSIS — M6281 Muscle weakness (generalized): Secondary | ICD-10-CM | POA: Diagnosis not present

## 2019-04-05 DIAGNOSIS — M79672 Pain in left foot: Secondary | ICD-10-CM | POA: Diagnosis not present

## 2019-04-05 DIAGNOSIS — M79671 Pain in right foot: Secondary | ICD-10-CM | POA: Diagnosis not present

## 2019-04-05 DIAGNOSIS — S92011S Displaced fracture of body of right calcaneus, sequela: Secondary | ICD-10-CM | POA: Diagnosis not present

## 2019-04-05 DIAGNOSIS — M7662 Achilles tendinitis, left leg: Secondary | ICD-10-CM | POA: Diagnosis not present

## 2019-04-09 DIAGNOSIS — M6281 Muscle weakness (generalized): Secondary | ICD-10-CM | POA: Diagnosis not present

## 2019-04-09 DIAGNOSIS — M25671 Stiffness of right ankle, not elsewhere classified: Secondary | ICD-10-CM | POA: Diagnosis not present

## 2019-04-09 DIAGNOSIS — M25672 Stiffness of left ankle, not elsewhere classified: Secondary | ICD-10-CM | POA: Diagnosis not present

## 2022-05-03 ENCOUNTER — Encounter: Payer: Self-pay | Admitting: *Deleted
